# Patient Record
Sex: Female | Born: 1955
Health system: Southern US, Community
[De-identification: ages and names within clinical notes are randomized; demographics above are authoritative.]

## PROBLEM LIST (undated history)

## (undated) DIAGNOSIS — E669 Obesity, unspecified: Secondary | ICD-10-CM

## (undated) DIAGNOSIS — I10 Essential (primary) hypertension: Secondary | ICD-10-CM

## (undated) HISTORY — PX: BREAST BIOPSY: SHX20

## (undated) HISTORY — DX: Essential (primary) hypertension: I10

## (undated) HISTORY — DX: Obesity, unspecified: E66.9

---

## 2000-03-24 ENCOUNTER — Encounter: Payer: Self-pay | Admitting: Family Medicine

## 2000-03-24 ENCOUNTER — Ambulatory Visit (HOSPITAL_COMMUNITY): Admission: RE | Admit: 2000-03-24 | Discharge: 2000-03-24 | Payer: Self-pay | Admitting: Family Medicine

## 2001-01-11 ENCOUNTER — Emergency Department (HOSPITAL_COMMUNITY): Admission: EM | Admit: 2001-01-11 | Discharge: 2001-01-11 | Payer: Self-pay | Admitting: *Deleted

## 2001-09-15 ENCOUNTER — Other Ambulatory Visit: Admission: RE | Admit: 2001-09-15 | Discharge: 2001-09-15 | Payer: Self-pay | Admitting: Obstetrics & Gynecology

## 2002-11-11 ENCOUNTER — Encounter: Payer: Self-pay | Admitting: Family Medicine

## 2002-11-11 ENCOUNTER — Ambulatory Visit (HOSPITAL_COMMUNITY): Admission: RE | Admit: 2002-11-11 | Discharge: 2002-11-11 | Payer: Self-pay | Admitting: Family Medicine

## 2003-11-15 ENCOUNTER — Other Ambulatory Visit: Admission: RE | Admit: 2003-11-15 | Discharge: 2003-11-15 | Payer: Self-pay | Admitting: Obstetrics & Gynecology

## 2004-12-17 ENCOUNTER — Other Ambulatory Visit: Admission: RE | Admit: 2004-12-17 | Discharge: 2004-12-17 | Payer: Self-pay | Admitting: Obstetrics & Gynecology

## 2005-12-25 ENCOUNTER — Other Ambulatory Visit: Admission: RE | Admit: 2005-12-25 | Discharge: 2005-12-25 | Payer: Self-pay | Admitting: Obstetrics & Gynecology

## 2009-05-30 ENCOUNTER — Observation Stay (HOSPITAL_COMMUNITY): Admission: EM | Admit: 2009-05-30 | Discharge: 2009-05-31 | Payer: Self-pay | Admitting: Emergency Medicine

## 2011-01-19 LAB — BASIC METABOLIC PANEL
BUN: 11 mg/dL (ref 6–23)
CO2: 26 mEq/L (ref 19–32)
Chloride: 108 mEq/L (ref 96–112)
Creatinine, Ser: 1 mg/dL (ref 0.4–1.2)
Potassium: 3.5 mEq/L (ref 3.5–5.1)

## 2011-01-19 LAB — POCT I-STAT, CHEM 8
BUN: 9 mg/dL (ref 6–23)
Calcium, Ion: 1.22 mmol/L (ref 1.12–1.32)
Chloride: 106 mEq/L (ref 96–112)
Glucose, Bld: 172 mg/dL — ABNORMAL HIGH (ref 70–99)
Potassium: 3.5 mEq/L (ref 3.5–5.1)

## 2011-01-19 LAB — URINALYSIS, ROUTINE W REFLEX MICROSCOPIC
Bilirubin Urine: NEGATIVE
Glucose, UA: NEGATIVE mg/dL
Hgb urine dipstick: NEGATIVE
Ketones, ur: NEGATIVE mg/dL
pH: 7.5 (ref 5.0–8.0)

## 2011-01-19 LAB — CBC
HCT: 32.6 % — ABNORMAL LOW (ref 36.0–46.0)
MCHC: 32.8 g/dL (ref 30.0–36.0)
MCV: 86 fL (ref 78.0–100.0)
Platelets: 225 10*3/uL (ref 150–400)
RBC: 3.79 MIL/uL — ABNORMAL LOW (ref 3.87–5.11)
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
## 2011-12-05 LAB — HM COLONOSCOPY

## 2013-05-03 ENCOUNTER — Ambulatory Visit: Payer: Self-pay | Admitting: *Deleted

## 2013-05-04 ENCOUNTER — Encounter: Payer: Self-pay | Attending: Internal Medicine | Admitting: *Deleted

## 2013-05-05 ENCOUNTER — Encounter: Payer: Self-pay | Admitting: *Deleted

## 2013-08-12 ENCOUNTER — Other Ambulatory Visit: Payer: Self-pay | Admitting: Occupational Medicine

## 2013-08-12 ENCOUNTER — Ambulatory Visit: Payer: Self-pay

## 2013-08-12 DIAGNOSIS — Z9289 Personal history of other medical treatment: Secondary | ICD-10-CM

## 2017-12-08 DIAGNOSIS — Z0000 Encounter for general adult medical examination without abnormal findings: Secondary | ICD-10-CM | POA: Diagnosis not present

## 2017-12-08 DIAGNOSIS — I129 Hypertensive chronic kidney disease with stage 1 through stage 4 chronic kidney disease, or unspecified chronic kidney disease: Secondary | ICD-10-CM | POA: Diagnosis not present

## 2017-12-08 DIAGNOSIS — I10 Essential (primary) hypertension: Secondary | ICD-10-CM | POA: Diagnosis not present

## 2017-12-08 DIAGNOSIS — N182 Chronic kidney disease, stage 2 (mild): Secondary | ICD-10-CM | POA: Diagnosis not present

## 2017-12-08 DIAGNOSIS — E2839 Other primary ovarian failure: Secondary | ICD-10-CM | POA: Diagnosis not present

## 2017-12-08 DIAGNOSIS — E559 Vitamin D deficiency, unspecified: Secondary | ICD-10-CM | POA: Diagnosis not present

## 2017-12-25 ENCOUNTER — Other Ambulatory Visit: Payer: Self-pay | Admitting: Internal Medicine

## 2017-12-25 DIAGNOSIS — E2839 Other primary ovarian failure: Secondary | ICD-10-CM

## 2017-12-29 ENCOUNTER — Other Ambulatory Visit: Payer: Self-pay | Admitting: Internal Medicine

## 2017-12-29 DIAGNOSIS — Z1231 Encounter for screening mammogram for malignant neoplasm of breast: Secondary | ICD-10-CM

## 2018-01-12 DIAGNOSIS — N182 Chronic kidney disease, stage 2 (mild): Secondary | ICD-10-CM | POA: Diagnosis not present

## 2018-01-12 DIAGNOSIS — R0683 Snoring: Secondary | ICD-10-CM | POA: Diagnosis not present

## 2018-01-12 DIAGNOSIS — Z79899 Other long term (current) drug therapy: Secondary | ICD-10-CM | POA: Diagnosis not present

## 2018-01-12 DIAGNOSIS — I129 Hypertensive chronic kidney disease with stage 1 through stage 4 chronic kidney disease, or unspecified chronic kidney disease: Secondary | ICD-10-CM | POA: Diagnosis not present

## 2018-01-13 ENCOUNTER — Ambulatory Visit: Payer: Self-pay

## 2018-02-10 ENCOUNTER — Ambulatory Visit
Admission: RE | Admit: 2018-02-10 | Discharge: 2018-02-10 | Disposition: A | Discharge status: Home / Self Care | Payer: 59 | Source: Ambulatory Visit | Attending: Internal Medicine | Admitting: Internal Medicine

## 2018-02-10 DIAGNOSIS — E2839 Other primary ovarian failure: Secondary | ICD-10-CM

## 2018-02-10 DIAGNOSIS — Z1231 Encounter for screening mammogram for malignant neoplasm of breast: Secondary | ICD-10-CM | POA: Diagnosis not present

## 2018-02-10 DIAGNOSIS — Z780 Asymptomatic menopausal state: Secondary | ICD-10-CM | POA: Diagnosis not present

## 2018-02-10 DIAGNOSIS — Z13820 Encounter for screening for osteoporosis: Secondary | ICD-10-CM | POA: Diagnosis not present

## 2018-02-13 ENCOUNTER — Other Ambulatory Visit: Payer: Self-pay

## 2018-02-13 ENCOUNTER — Ambulatory Visit: Payer: Self-pay

## 2018-02-25 DIAGNOSIS — N182 Chronic kidney disease, stage 2 (mild): Secondary | ICD-10-CM | POA: Diagnosis not present

## 2018-02-25 DIAGNOSIS — Z6834 Body mass index (BMI) 34.0-34.9, adult: Secondary | ICD-10-CM | POA: Diagnosis not present

## 2018-02-25 DIAGNOSIS — Z9114 Patient's other noncompliance with medication regimen: Secondary | ICD-10-CM | POA: Diagnosis not present

## 2018-02-25 DIAGNOSIS — I129 Hypertensive chronic kidney disease with stage 1 through stage 4 chronic kidney disease, or unspecified chronic kidney disease: Secondary | ICD-10-CM | POA: Diagnosis not present

## 2018-04-30 DIAGNOSIS — N182 Chronic kidney disease, stage 2 (mild): Secondary | ICD-10-CM | POA: Diagnosis not present

## 2018-04-30 DIAGNOSIS — I129 Hypertensive chronic kidney disease with stage 1 through stage 4 chronic kidney disease, or unspecified chronic kidney disease: Secondary | ICD-10-CM | POA: Diagnosis not present

## 2018-04-30 DIAGNOSIS — Z6834 Body mass index (BMI) 34.0-34.9, adult: Secondary | ICD-10-CM | POA: Diagnosis not present

## 2018-08-31 ENCOUNTER — Other Ambulatory Visit: Payer: Self-pay | Admitting: Internal Medicine

## 2018-08-31 ENCOUNTER — Ambulatory Visit: Payer: 59 | Admitting: Internal Medicine

## 2018-08-31 ENCOUNTER — Encounter: Payer: Self-pay | Admitting: Internal Medicine

## 2018-08-31 VITALS — BP 124/78 | HR 88 | Temp 98.2°F | Ht 65.5 in | Wt 208.6 lb

## 2018-08-31 DIAGNOSIS — E7800 Pure hypercholesterolemia, unspecified: Secondary | ICD-10-CM

## 2018-08-31 DIAGNOSIS — E669 Obesity, unspecified: Secondary | ICD-10-CM

## 2018-08-31 DIAGNOSIS — Z6834 Body mass index (BMI) 34.0-34.9, adult: Secondary | ICD-10-CM | POA: Diagnosis not present

## 2018-08-31 DIAGNOSIS — I129 Hypertensive chronic kidney disease with stage 1 through stage 4 chronic kidney disease, or unspecified chronic kidney disease: Secondary | ICD-10-CM

## 2018-08-31 DIAGNOSIS — N182 Chronic kidney disease, stage 2 (mild): Secondary | ICD-10-CM | POA: Diagnosis not present

## 2018-08-31 DIAGNOSIS — R7309 Other abnormal glucose: Secondary | ICD-10-CM

## 2018-08-31 NOTE — Patient Instructions (Signed)
Exercising to Lose Weight Exercising can help you to lose weight. In order to lose weight through exercise, you need to do vigorous-intensity exercise. You can tell that you are exercising with vigorous intensity if you are breathing very hard and fast and cannot hold a conversation while exercising. Moderate-intensity exercise helps to maintain your current weight. You can tell that you are exercising at a moderate level if you have a higher heart rate and faster breathing, but you are still able to hold a conversation. How often should I exercise? Choose an activity that you enjoy and set realistic goals. Your health care provider can help you to make an activity plan that works for you. Exercise regularly as directed by your health care provider. This may include:  Doing resistance training twice each week, such as: ? Push-ups. ? Sit-ups. ? Lifting weights. ? Using resistance bands.  Doing a given intensity of exercise for a given amount of time. Choose from these options: ? 150 minutes of moderate-intensity exercise every week. ? 75 minutes of vigorous-intensity exercise every week. ? A mix of moderate-intensity and vigorous-intensity exercise every week.  Children, pregnant women, people who are out of shape, people who are overweight, and older adults may need to consult a health care provider for individual recommendations. If you have any sort of medical condition, be sure to consult your health care provider before starting a new exercise program. What are some activities that can help me to lose weight?  Walking at a rate of at least 4.5 miles an hour.  Jogging or running at a rate of 5 miles per hour.  Biking at a rate of at least 10 miles per hour.  Lap swimming.  Roller-skating or in-line skating.  Cross-country skiing.  Vigorous competitive sports, such as football, basketball, and soccer.  Jumping rope.  Aerobic dancing. How can I be more active in my day-to-day  activities?  Use the stairs instead of the elevator.  Take a walk during your lunch break.  If you drive, park your car farther away from work or school.  If you take public transportation, get off one stop early and walk the rest of the way.  Make all of your phone calls while standing up and walking around.  Get up, stretch, and walk around every 30 minutes throughout the day. What guidelines should I follow while exercising?  Do not exercise so much that you hurt yourself, feel dizzy, or get very short of breath.  Consult your health care provider prior to starting a new exercise program.  Wear comfortable clothes and shoes with good support.  Drink plenty of water while you exercise to prevent dehydration or heat stroke. Body water is lost during exercise and must be replaced.  Work out until you breathe faster and your heart beats faster. This information is not intended to replace advice given to you by your health care provider. Make sure you discuss any questions you have with your health care provider. Document Released: 11/02/2010 Document Revised: 03/07/2016 Document Reviewed: 03/03/2014 Elsevier Interactive Patient Education  2018 Elsevier Inc.  

## 2018-08-31 NOTE — Progress Notes (Signed)
Subjective:     Patient ID: Shannon RavelingSharon C Roop , female    DOB: 10/20/1955 , 62 y.o.   MRN: [id number]   Chief Complaint  Patient presents with  . Hypertension    HPI  Hypertension  This is a chronic problem. The current episode started more than 1 year ago. The problem has been gradually improving since onset. The problem is controlled. Pertinent negatives include no blurred vision, chest pain, headaches, neck pain, orthopnea, palpitations or shortness of breath. Risk factors for coronary artery disease include sedentary lifestyle, obesity and post-menopausal state.     Past Medical History:  Diagnosis Date  . HTN (hypertension)   . Obesity      Family History  Problem Relation Age of Onset  . Hypertension Mother   . Diabetes Mother   . Cancer Mother   . Stroke Father      Current Outpatient Medications:  .  amLODipine (NORVASC) 5 MG tablet, Take 5 mg by mouth daily., Disp: , Rfl:  .  Ascorbic Acid (VITAMIN C PO), Take by mouth., Disp: , Rfl:  .  Probiotic Product (PROBIOTIC PO), Take by mouth., Disp: , Rfl:  .  telmisartan-hydrochlorothiazide (MICARDIS HCT) 80-25 MG tablet, Take 1 tablet by mouth daily., Disp: , Rfl:  .  Vitamin D, Ergocalciferol, (DRISDOL) 1.25 MG (50000 UT) CAPS capsule, Take 50,000 Units by mouth every 7 (seven) days., Disp: , Rfl:    Allergies  Allergen Reactions  . Codeine Nausea And Vomiting     Review of Systems  Constitutional: Negative.   Eyes: Negative for blurred vision.  Respiratory: Negative.  Negative for shortness of breath.   Cardiovascular: Negative.  Negative for chest pain, palpitations and orthopnea.  Gastrointestinal: Negative.   Musculoskeletal: Negative for neck pain.  Skin: Negative.   Neurological: Negative.  Negative for headaches.  Psychiatric/Behavioral: Negative.      Today's Vitals   08/31/18 1039  BP: 124/78  Pulse: 88  Temp: 98.2 F (36.8 C)  TempSrc: Oral  Weight: 208 lb 9.6 oz (94.6 kg)  Height: 5' 5.5"  (1.664 m)   Body mass index is 34.18 kg/m.   Objective:  Physical Exam  Constitutional: She is oriented to person, place, and time. She appears well-developed and well-nourished.  HENT:  Head: Normocephalic and atraumatic.  Eyes: EOM are normal.  Cardiovascular: Normal rate, regular rhythm and normal heart sounds.  Pulmonary/Chest: Effort normal and breath sounds normal.  Neurological: She is alert and oriented to person, place, and time.  Psychiatric: She has a normal mood and affect.  Nursing note and vitals reviewed.       Assessment And Plan:     1. Hypertensive nephropathy  Well controlled. She will continue with current meds. She is encouraged to avoid adding salt to her foods. She will rto in six months for re-evaluation. Her labs will be sent to [facility] as requested.   - COMPLETE METABOLIC PANEL WITH GFR - Lipid Profile  2. Chronic renal disease, stage II  Chronic. She is encouraged to stay well hydrated.   3. Other abnormal glucose  HER A1C HAS BEEN ELEVATED IN THE PAST. I WILL CHECK AN A1C, BMET TODAY. SHE WAS ENCOURAGED TO AVOID SUGARY BEVERAGES AND PROCESSED FOODS INCLUDNG BREADS, RICE AND PASTA.  - Hemoglobin A1c  4. Pure hypercholesterolemia  I will check a fasting lipid panel. She is encouraged to avoid fried foods and to incorporate more exercise into her daily routine.   - Lipid Profile  5.  Class 1 obesity with serious comorbidity and body mass index (BMI) of 34.0 to 34.9 in adult, unspecified obesity type  She is encouraged to strive for BMI less than 30 to decrease cardiac risk. She is encouraged to avoid sugary beverages, processed foods and to exercise no less than five days weekly. She will rto in six months for re-evaluation.        Gwynneth Aliment, MD

## 2018-09-01 LAB — COMPLETE METABOLIC PANEL WITH GFR
AG RATIO: 1.6 (calc) (ref 1.0–2.5)
ALBUMIN MSPROF: 4.2 g/dL (ref 3.6–5.1)
ALT: 18 U/L (ref 6–29)
AST: 16 U/L (ref 10–35)
Alkaline phosphatase (APISO): 73 U/L (ref 33–130)
BILIRUBIN TOTAL: 0.6 mg/dL (ref 0.2–1.2)
BUN: 11 mg/dL (ref 7–25)
CHLORIDE: 102 mmol/L (ref 98–110)
CO2: 30 mmol/L (ref 20–32)
Calcium: 10 mg/dL (ref 8.6–10.4)
Creat: 0.95 mg/dL (ref 0.50–0.99)
GFR, EST AFRICAN AMERICAN: 75 mL/min/{1.73_m2} (ref >=60)
GFR, Est Non African American: 65 mL/min/{1.73_m2} (ref >=60)
GLOBULIN: 2.7 g/dL (ref 1.9–3.7)
GLUCOSE: 111 mg/dL — AB (ref 65–99)
Potassium: 3.3 mmol/L — ABNORMAL LOW (ref 3.5–5.3)
SODIUM: 141 mmol/L (ref 135–146)
TOTAL PROTEIN: 6.9 g/dL (ref 6.1–8.1)

## 2018-09-01 LAB — HEMOGLOBIN A1C
Hgb A1c MFr Bld: 6.3 % of total Hgb — ABNORMAL HIGH (ref <5.7)
Mean Plasma Glucose: 134 (calc)
eAG (mmol/L): 7.4 (calc)

## 2018-09-01 LAB — LIPID PANEL
CHOLESTEROL: 208 mg/dL — AB (ref <200)
HDL: 43 mg/dL — ABNORMAL LOW (ref >50)
LDL Cholesterol (Calc): 138 mg/dL (calc) — ABNORMAL HIGH
Non-HDL Cholesterol (Calc): 165 mg/dL (calc) — ABNORMAL HIGH (ref <130)
Total CHOL/HDL Ratio: 4.8 (calc) (ref <5.0)
Triglycerides: 147 mg/dL (ref <150)

## 2018-09-04 NOTE — Progress Notes (Signed)
Your ldl, bad chol is 138. Ideally, this should be less than 100.  Your kidney fxn is stable. Your potassium level is sl. Low, please increase intake of potassium rich foods. Please mail her a list of foods. Liver function is normal. Your hba1c is 6.3, this is in predm range. At 6.5 or above, you will be considered diabetic.

## 2018-09-09 ENCOUNTER — Telehealth: Payer: Self-pay

## 2018-09-09 NOTE — Telephone Encounter (Signed)
-----   Message from Dorothyann Pengobyn Sanders, MD sent at 09/04/2018  7:53 PM EST ----- Your ldl, bad chol is 138. Ideally, this should be less than 100.  Your kidney fxn is stable. Your potassium level is sl. Low, please increase intake of potassium rich foods. Please mail her a list of foods. Liver function is normal. Your hba1c is 6.3, this is in predm range. At 6.5 or above, you will be considered diabetic.

## 2018-09-09 NOTE — Telephone Encounter (Signed)
Left the pt a message to call back for her lab results. 

## 2018-10-06 ENCOUNTER — Ambulatory Visit: Payer: 59 | Admitting: Internal Medicine

## 2018-10-06 ENCOUNTER — Encounter: Payer: Self-pay | Admitting: Internal Medicine

## 2018-10-06 VITALS — BP 130/86 | HR 81 | Temp 98.1°F | Ht 65.5 in | Wt 209.8 lb

## 2018-10-06 DIAGNOSIS — E6609 Other obesity due to excess calories: Secondary | ICD-10-CM | POA: Diagnosis not present

## 2018-10-06 DIAGNOSIS — E66811 Obesity, class 1: Secondary | ICD-10-CM | POA: Insufficient documentation

## 2018-10-06 DIAGNOSIS — Z6834 Body mass index (BMI) 34.0-34.9, adult: Secondary | ICD-10-CM | POA: Diagnosis not present

## 2018-10-06 DIAGNOSIS — K047 Periapical abscess without sinus: Secondary | ICD-10-CM | POA: Diagnosis not present

## 2018-10-06 DIAGNOSIS — K0889 Other specified disorders of teeth and supporting structures: Secondary | ICD-10-CM | POA: Diagnosis not present

## 2018-10-06 DIAGNOSIS — I10 Essential (primary) hypertension: Secondary | ICD-10-CM | POA: Insufficient documentation

## 2018-10-06 MED ORDER — FLUCONAZOLE 150 MG PO TABS: ORAL_TABLET | ORAL | 0 refills | Status: DC

## 2018-10-06 MED ORDER — IBUPROFEN 800 MG PO TABS: 800.0000 mg | ORAL_TABLET | Freq: Three times a day (TID) | ORAL | 0 refills | Status: DC | PRN

## 2018-10-06 MED ORDER — AMOXICILLIN-POT CLAVULANATE 875-125 MG PO TABS: 1.0000 | ORAL_TABLET | Freq: Two times a day (BID) | ORAL | 0 refills | Status: AC

## 2018-10-10 ENCOUNTER — Encounter: Payer: Self-pay | Admitting: Internal Medicine

## 2018-10-10 NOTE — Progress Notes (Signed)
Subjective:     Patient ID: Shannon RavelingSharon C Tremain , female    DOB: 10/20/1955 , 62 y.o.   MRN: [id number]   Chief Complaint  Patient presents with  . Dental Pain    patient states her tooth broke during the summer time and she is now starting to have some tooth pain but she is unable to see dentist January.    HPI  Dental Pain   This is a recurrent problem. The current episode started in the past 7 days. The problem occurs constantly. The problem has been unchanged. The pain is at a severity of 6/10. The pain is moderate. She has tried acetaminophen for the symptoms. The treatment provided no relief.   She reports she broke her tooth earlier this summer. Had to find new dentist b/c previous dentist was not in her plan. She is not able to be seen until Jan 2020. She has having severe tooth pain. No relief with otc meds.   Past Medical History:  Diagnosis Date  . HTN (hypertension)   . Obesity      Family History  Problem Relation Age of Onset  . Hypertension Mother   . Diabetes Mother   . Cancer Mother   . Stroke Father      Current Outpatient Medications:  .  amLODipine (NORVASC) 5 MG tablet, Take 5 mg by mouth daily., Disp: , Rfl:  .  Ascorbic Acid (VITAMIN C PO), Take by mouth., Disp: , Rfl:  .  Cholecalciferol (VITAMIN D-3) 125 MCG (5000 UT) TABS, Take 1 tablet by mouth daily., Disp: , Rfl:  .  Probiotic Product (PROBIOTIC PO), Take by mouth., Disp: , Rfl:  .  telmisartan-hydrochlorothiazide (MICARDIS HCT) 80-25 MG tablet, Take 1 tablet by mouth daily., Disp: , Rfl:  .  amoxicillin-clavulanate (AUGMENTIN) 875-125 MG tablet, Take 1 tablet by mouth 2 (two) times daily for 10 days., Disp: 20 tablet, Rfl: 0 .  fluconazole (DIFLUCAN) 150 MG tablet, Take one tablet today, repeat in 48 hours, Disp: 2 tablet, Rfl: 0 .  ibuprofen (ADVIL,MOTRIN) 800 MG tablet, Take 1 tablet (800 mg total) by mouth every 8 (eight) hours as needed., Disp: 30 tablet, Rfl: 0   Allergies  Allergen Reactions   . Codeine Nausea And Vomiting     Review of Systems  Constitutional: Negative.   HENT: Positive for facial swelling.   Respiratory: Negative.   Cardiovascular: Negative.   Gastrointestinal: Negative.   Neurological: Negative.   Psychiatric/Behavioral: Negative.      Today's Vitals   10/06/18 1412  BP: 130/86  Pulse: 81  Temp: 98.1 F (36.7 C)  TempSrc: Oral  SpO2: 96%  Weight: 209 lb 12.8 oz (95.2 kg)  Height: 5' 5.5" (1.664 m)  PainSc: 2   PainLoc: Mouth   Body mass index is 34.38 kg/m.   Objective:  Physical Exam Vitals signs and nursing note reviewed.  Constitutional:      General: She is in acute distress.     Appearance: Normal appearance.  HENT:     Head: Normocephalic and atraumatic.     Mouth/Throat:     Comments: Multiple caries, left maxillary molar with deep cavity. There is some gingival swelling and left cheek swelling.  Cardiovascular:     Rate and Rhythm: Normal rate and regular rhythm.     Heart sounds: Normal heart sounds.  Pulmonary:     Effort: Pulmonary effort is normal.     Breath sounds: Normal breath sounds.  Neurological:  General: No focal deficit present.     Mental Status: She is alert.         Assessment And Plan:     1. Tooth infection  She was given rx augmentin to take as directed. She is encouraged to complete full abx course.   2. Tooth pain  She was given rx ibuprofen to use prn. She is allergic to codeine and hesitant to take vicodin or tramadol. She will let me know if the ibuprofen is not effective at controlling her pain.   3. Class 1 obesity due to excess calories with serious comorbidity and body mass index (BMI) of 34.0 to 34.9 in adult  She is encouraged to strive for BMI less than 30 to decrease cardiac risk.   Gwynneth Alimentobyn N Nikiah Goin, MD

## 2018-11-11 ENCOUNTER — Telehealth: Payer: Self-pay

## 2018-11-11 MED ORDER — FLUCONAZOLE 150 MG PO TABS: ORAL_TABLET | ORAL | 0 refills | Status: DC

## 2018-11-11 NOTE — Telephone Encounter (Signed)
Left the pt a message that the rx for diflucan was faxed to the pharmacy.

## 2019-01-18 ENCOUNTER — Encounter: Payer: Self-pay | Admitting: Internal Medicine

## 2019-02-05 ENCOUNTER — Other Ambulatory Visit: Payer: Self-pay | Admitting: Internal Medicine

## 2019-03-01 ENCOUNTER — Ambulatory Visit (INDEPENDENT_AMBULATORY_CARE_PROVIDER_SITE_OTHER): Payer: 59 | Admitting: Internal Medicine

## 2019-03-01 ENCOUNTER — Encounter: Payer: Self-pay | Admitting: Internal Medicine

## 2019-03-01 ENCOUNTER — Other Ambulatory Visit: Payer: Self-pay

## 2019-03-01 VITALS — BP 154/96 | HR 90 | Temp 98.7°F | Ht 66.6 in | Wt 213.0 lb

## 2019-03-01 DIAGNOSIS — Z0000 Encounter for general adult medical examination without abnormal findings: Secondary | ICD-10-CM

## 2019-03-01 DIAGNOSIS — I129 Hypertensive chronic kidney disease with stage 1 through stage 4 chronic kidney disease, or unspecified chronic kidney disease: Secondary | ICD-10-CM

## 2019-03-01 DIAGNOSIS — N182 Chronic kidney disease, stage 2 (mild): Secondary | ICD-10-CM | POA: Diagnosis not present

## 2019-03-01 LAB — POCT UA - MICROALBUMIN
Albumin/Creatinine Ratio, Urine, POC: <30
Creatinine, POC: 200 mg/dL
Microalbumin Ur, POC: 10 mg/L

## 2019-03-01 LAB — POCT URINALYSIS DIPSTICK
Bilirubin, UA: NEGATIVE
Blood, UA: NEGATIVE
Glucose, UA: NEGATIVE
Ketones, UA: NEGATIVE
Leukocytes, UA: NEGATIVE
Nitrite, UA: NEGATIVE
Protein, UA: NEGATIVE
Spec Grav, UA: 1.02 (ref 1.010–1.025)
Urobilinogen, UA: 0.2 E.U./dL
pH, UA: 6 (ref 5.0–8.0)

## 2019-03-01 NOTE — Patient Instructions (Signed)

## 2019-03-02 ENCOUNTER — Encounter: Payer: Self-pay | Admitting: Internal Medicine

## 2019-03-02 LAB — COMPLETE METABOLIC PANEL WITH GFR
AG Ratio: 1.5 (calc) (ref 1.0–2.5)
ALT: 13 U/L (ref 6–29)
AST: 14 U/L (ref 10–35)
Albumin: 4.4 g/dL (ref 3.6–5.1)
Alkaline phosphatase (APISO): 72 U/L (ref 37–153)
BUN: 11 mg/dL (ref 7–25)
CO2: 28 mmol/L (ref 20–32)
Calcium: 10.3 mg/dL (ref 8.6–10.4)
Chloride: 101 mmol/L (ref 98–110)
Creat: 0.96 mg/dL (ref 0.50–0.99)
GFR, Est African American: 73 mL/min/{1.73_m2} (ref >=60)
GFR, Est Non African American: 63 mL/min/{1.73_m2} (ref >=60)
Globulin: 2.9 g/dL (calc) (ref 1.9–3.7)
Glucose, Bld: 97 mg/dL (ref 65–99)
Potassium: 4.1 mmol/L (ref 3.5–5.3)
Sodium: 139 mmol/L (ref 135–146)
Total Bilirubin: 0.4 mg/dL (ref 0.2–1.2)
Total Protein: 7.3 g/dL (ref 6.1–8.1)

## 2019-03-02 LAB — TSH: TSH: 1.84 mIU/L (ref 0.40–4.50)

## 2019-03-02 LAB — CBC
HCT: 38.5 % (ref 35.0–45.0)
Hemoglobin: 12.7 g/dL (ref 11.7–15.5)
MCH: 27.9 pg (ref 27.0–33.0)
MCHC: 33 g/dL (ref 32.0–36.0)
MCV: 84.6 fL (ref 80.0–100.0)
MPV: 11.3 fL (ref 7.5–12.5)
Platelets: 285 10*3/uL (ref 140–400)
RBC: 4.55 10*6/uL (ref 3.80–5.10)
RDW: 13.2 % (ref 11.0–15.0)
WBC: 5.5 10*3/uL (ref 3.8–10.8)

## 2019-03-02 LAB — LIPID PANEL
Cholesterol: 212 mg/dL — ABNORMAL HIGH (ref <200)
HDL: 54 mg/dL (ref >=50)
LDL Cholesterol (Calc): 137 mg/dL (calc) — ABNORMAL HIGH
Non-HDL Cholesterol (Calc): 158 mg/dL (calc) — ABNORMAL HIGH (ref <130)
Total CHOL/HDL Ratio: 3.9 (calc) (ref <5.0)
Triglycerides: 105 mg/dL (ref <150)

## 2019-03-02 LAB — HEMOGLOBIN A1C
Hgb A1c MFr Bld: 6.1 % of total Hgb — ABNORMAL HIGH (ref <5.7)
Mean Plasma Glucose: 128 (calc)
eAG (mmol/L): 7.1 (calc)

## 2019-03-04 ENCOUNTER — Encounter: Payer: Self-pay | Admitting: Internal Medicine

## 2019-03-08 NOTE — Progress Notes (Signed)
Subjective:     Patient ID: Shannon Mendoza , female    DOB: 28-Apr-1956 , 63 y.o.   MRN: [id number]   Chief Complaint  Patient presents with  . Annual Exam  . Hypertension    HPI  She is here today for a full physical examination. She has no specific concerns or complaints at this time. She is followed by GYN for her pelvic examinations.   Hypertension  This is a chronic problem. The current episode started more than 1 year ago. The problem has been gradually improving since onset. The problem is uncontrolled. Pertinent negatives include no blurred vision, chest pain, palpitations or shortness of breath. Risk factors for coronary artery disease include obesity, post-menopausal state and sedentary lifestyle. The current treatment provides moderate improvement.     Past Medical History:  Diagnosis Date  . HTN (hypertension)   . Obesity      Family History  Problem Relation Age of Onset  . Hypertension Mother   . Diabetes Mother   . Cancer Mother   . Stroke Father      Current Outpatient Medications:  .  amLODipine (NORVASC) 5 MG tablet, TAKE 1 TABLET BY MOUTH EVERY DAY, Disp: 90 tablet, Rfl: 1 .  telmisartan-hydrochlorothiazide (MICARDIS HCT) 80-25 MG tablet, Take 1 tablet by mouth daily., Disp: , Rfl:  .  Ascorbic Acid (VITAMIN C PO), Take by mouth., Disp: , Rfl:  .  Cholecalciferol (VITAMIN D-3) 125 MCG (5000 UT) TABS, Take 1 tablet by mouth daily., Disp: , Rfl:  .  Probiotic Product (PROBIOTIC PO), Take by mouth., Disp: , Rfl:    Allergies  Allergen Reactions  . Codeine Nausea And Vomiting     The patient states she uses none for birth control. Last LMP was No LMP recorded. Patient is postmenopausal.. Negative for Dysmenorrhea  Negative for: breast discharge, breast lump(s), breast pain and breast self exam. Associated symptoms include abnormal vaginal bleeding. Pertinent negatives include abnormal bleeding (hematology), anxiety, decreased libido, depression,  difficulty falling sleep, dyspareunia, history of infertility, nocturia, sexual dysfunction, sleep disturbances, urinary incontinence, urinary urgency, vaginal discharge and vaginal itching. Diet regular.The patient states her exercise level is  minimal.  . The patient's tobacco use is:  Social History   Tobacco Use  Smoking Status Never Smoker  Smokeless Tobacco Never Used  . She has been exposed to passive smoke. The patient's alcohol use is:  Social History   Substance and Sexual Activity  Alcohol Use Not Currently    Review of Systems  Constitutional: Negative.   HENT: Negative.   Eyes: Negative.  Negative for blurred vision.  Respiratory: Negative.  Negative for shortness of breath.   Cardiovascular: Negative.  Negative for chest pain and palpitations.  Gastrointestinal: Negative.   Endocrine: Negative.   Genitourinary: Negative.   Musculoskeletal: Negative.   Skin: Negative.   Allergic/Immunologic: Negative.   Neurological: Negative.   Hematological: Negative.   Psychiatric/Behavioral: Negative.      Today's Vitals   03/01/19 1140  BP: (!) 154/96  Pulse: 90  Temp: 98.7 F (37.1 C)  TempSrc: Oral  Weight: 213 lb (96.6 kg)  Height: 5' 6.6" (1.692 m)   Body mass index is 33.76 kg/m.   Objective:  Physical Exam Vitals signs and nursing note reviewed.  Constitutional:      Appearance: Normal appearance.  HENT:     Head: Normocephalic and atraumatic.     Right Ear: Tympanic membrane, ear canal and external ear normal.  Left Ear: Tympanic membrane, ear canal and external ear normal.     Nose: Nose normal.     Mouth/Throat:     Mouth: Mucous membranes are moist.     Pharynx: Oropharynx is clear.  Eyes:     Extraocular Movements: Extraocular movements intact.     Conjunctiva/sclera: Conjunctivae normal.     Pupils: Pupils are equal, round, and reactive to light.  Neck:     Musculoskeletal: Normal range of motion and neck supple.  Cardiovascular:      Rate and Rhythm: Normal rate and regular rhythm.     Pulses: Normal pulses.     Heart sounds: Normal heart sounds.  Pulmonary:     Effort: Pulmonary effort is normal.     Breath sounds: Normal breath sounds.  Chest:     Breasts:        Right: Normal. No swelling, bleeding, inverted nipple, mass or nipple discharge.        Left: Normal. No swelling, bleeding, inverted nipple, mass or nipple discharge.  Abdominal:     General: Bowel sounds are normal.     Palpations: Abdomen is soft.  Genitourinary:    Comments: deferred Musculoskeletal: Normal range of motion.  Skin:    General: Skin is warm and dry.  Neurological:     General: No focal deficit present.     Mental Status: She is alert and oriented to person, place, and time.  Psychiatric:        Mood and Affect: Mood normal.        Behavior: Behavior normal.         Assessment And Plan:     1. Routine general medical examination at health care facility  A full exam was performed. Importance of monthly self breast exams was discussed with the patient. PATIENT HAS BEEN ADVISED TO GET 30-45 MINUTES REGULAR EXERCISE NO LESS THAN FOUR TO FIVE DAYS PER WEEK - BOTH WEIGHTBEARING EXERCISES AND AEROBIC ARE RECOMMENDED.  SHE WAS ADVISED TO FOLLOW A HEALTHY DIET WITH AT LEAST SIX FRUITS/VEGGIES PER DAY, DECREASE INTAKE OF RED MEAT, AND TO INCREASE FISH INTAKE TO TWO DAYS PER WEEK.  MEATS/FISH SHOULD NOT BE FRIED, BAKED OR BROILED IS PREFERABLE.  I SUGGEST WEARING SPF 50 SUNSCREEN ON EXPOSED PARTS AND ESPECIALLY WHEN IN THE DIRECT SUNLIGHT FOR AN EXTENDED PERIOD OF TIME.  PLEASE AVOID FAST FOOD RESTAURANTS AND INCREASE YOUR WATER INTAKE.  - CBC - Lipid panel - Hemoglobin A1c - TSH  2. Hypertensive nephropathy  Uncontrolled. She has yet to take her meds today. She will continue with current meds. EKG performed, no acute changes noted. She is encouraged to take meds daily, avoid adding salt to her foods and to avoid packaged foods which  tend to be high in sodium. She will rto in six months for re-evaluation. Importance of regular exercise was also discussed with the patient.  - COMPLETE METABOLIC PANEL WITH GFR - POCT Urinalysis Dipstick (81002) - POCT UA - Microalbumin - EKG 12-Lead  3. Chronic renal disease, stage II  Chronic. She is encouraged to stay well hydrated. I will check GFR, Cr today.   Gwynneth Alimentobyn N Leveda Kendrix, MD    THE PATIENT IS ENCOURAGED TO PRACTICE SOCIAL DISTANCING DUE TO THE COVID-19 PANDEMIC.

## 2019-04-06 ENCOUNTER — Other Ambulatory Visit: Payer: Self-pay | Admitting: Internal Medicine

## 2019-04-06 DIAGNOSIS — Z1231 Encounter for screening mammogram for malignant neoplasm of breast: Secondary | ICD-10-CM

## 2019-04-26 ENCOUNTER — Other Ambulatory Visit: Payer: Self-pay | Admitting: Internal Medicine

## 2019-04-28 ENCOUNTER — Other Ambulatory Visit: Payer: Self-pay

## 2019-04-28 MED ORDER — TELMISARTAN-HCTZ 80-25 MG PO TABS: 1.0000 | ORAL_TABLET | Freq: Every day | ORAL | 1 refills | Status: DC

## 2019-05-17 ENCOUNTER — Ambulatory Visit
Admission: RE | Admit: 2019-05-17 | Discharge: 2019-05-17 | Disposition: A | Discharge status: Home / Self Care | Payer: 59 | Source: Ambulatory Visit | Attending: Internal Medicine | Admitting: Internal Medicine

## 2019-05-17 ENCOUNTER — Other Ambulatory Visit: Payer: Self-pay

## 2019-05-17 DIAGNOSIS — Z1231 Encounter for screening mammogram for malignant neoplasm of breast: Secondary | ICD-10-CM

## 2019-10-27 ENCOUNTER — Other Ambulatory Visit: Payer: Self-pay

## 2019-10-27 ENCOUNTER — Other Ambulatory Visit: Payer: Self-pay | Admitting: Internal Medicine

## 2019-11-18 IMAGING — MG DIGITAL SCREENING BILATERAL MAMMOGRAM WITH TOMO AND CAD
8 series · 8 of 24 positions shown · non-contrast
Comparison: Previous exam(s).

CLINICAL DATA: Screening.

EXAM:
DIGITAL SCREENING BILATERAL MAMMOGRAM WITH TOMO AND CAD

[L MLO synth-2D]
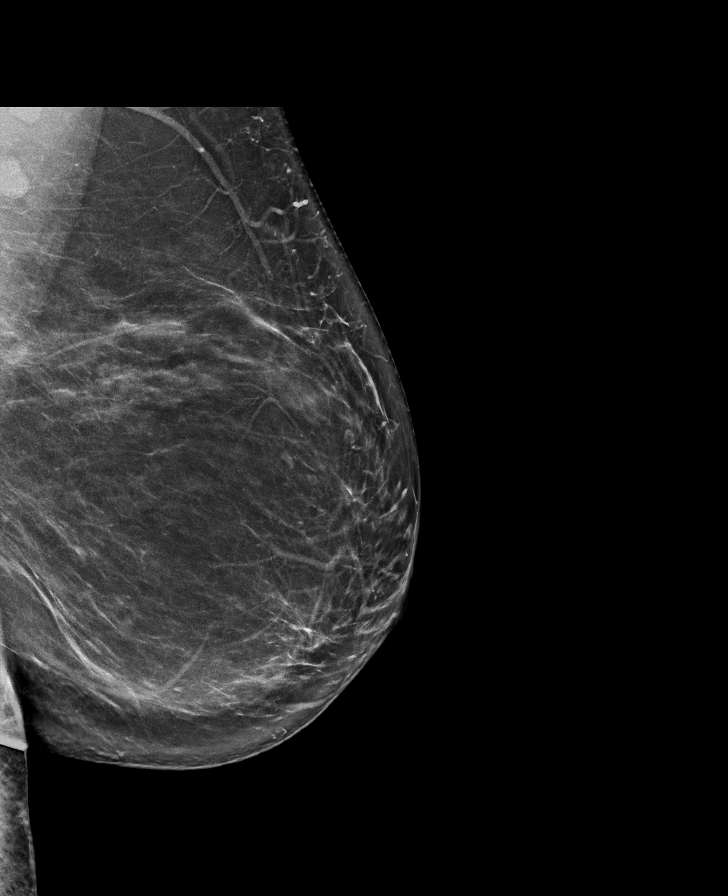

[R MLO synth-2D]
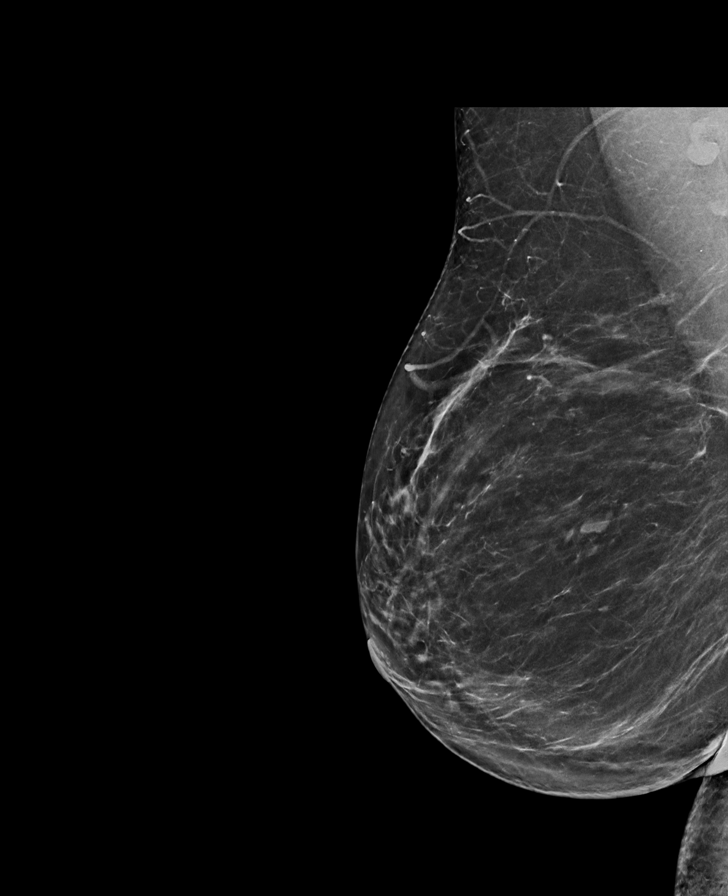

[R CC synth-2D]
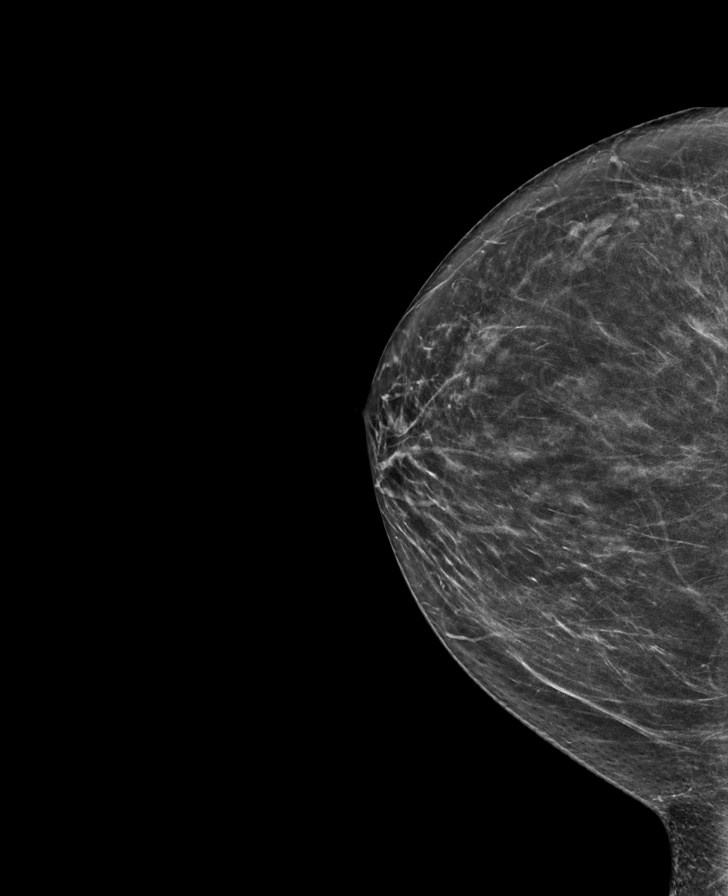

[L CC synth-2D]
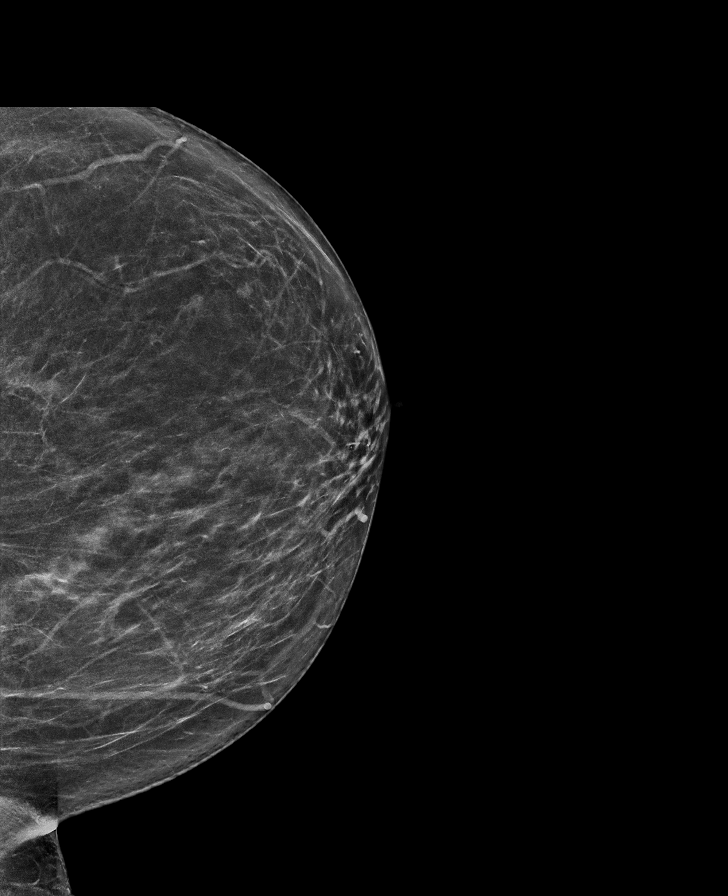

[L CC tomo · tomo slice 36/71.0]
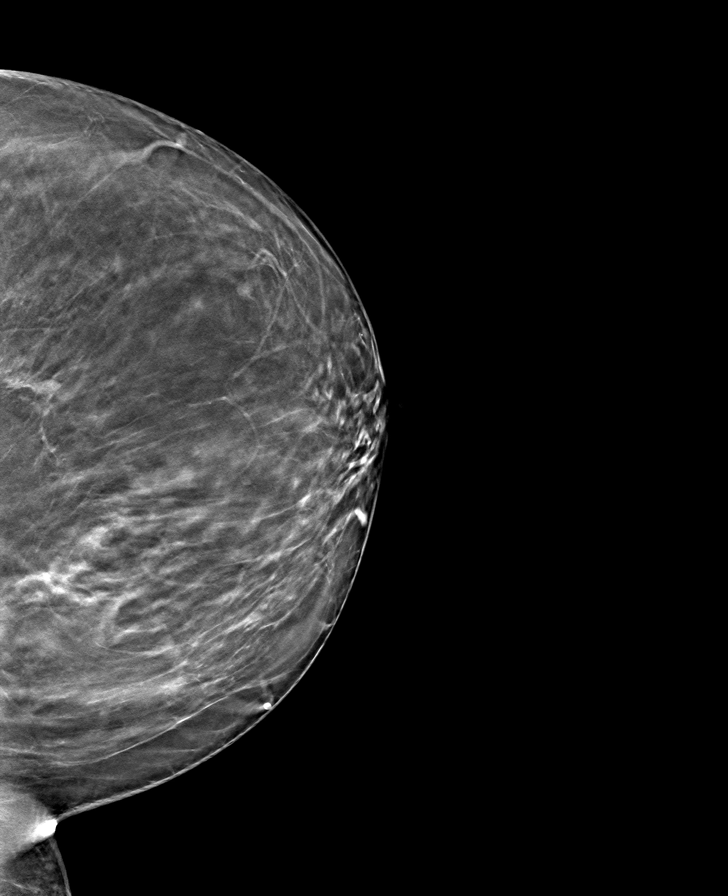

[R MLO tomo · tomo slice 41/81.0]
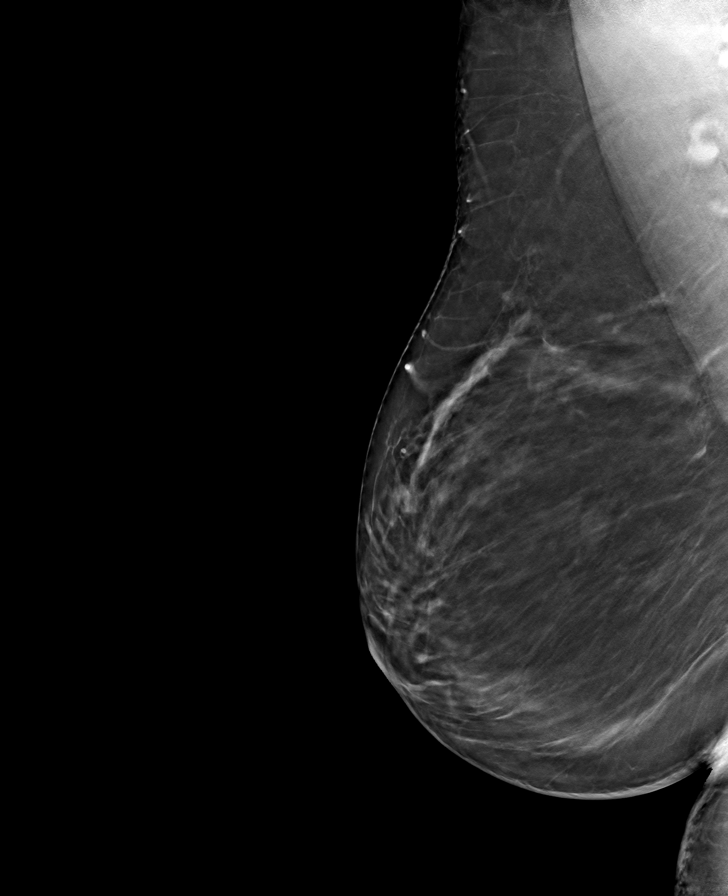

[L MLO tomo · tomo slice 43/84.0]
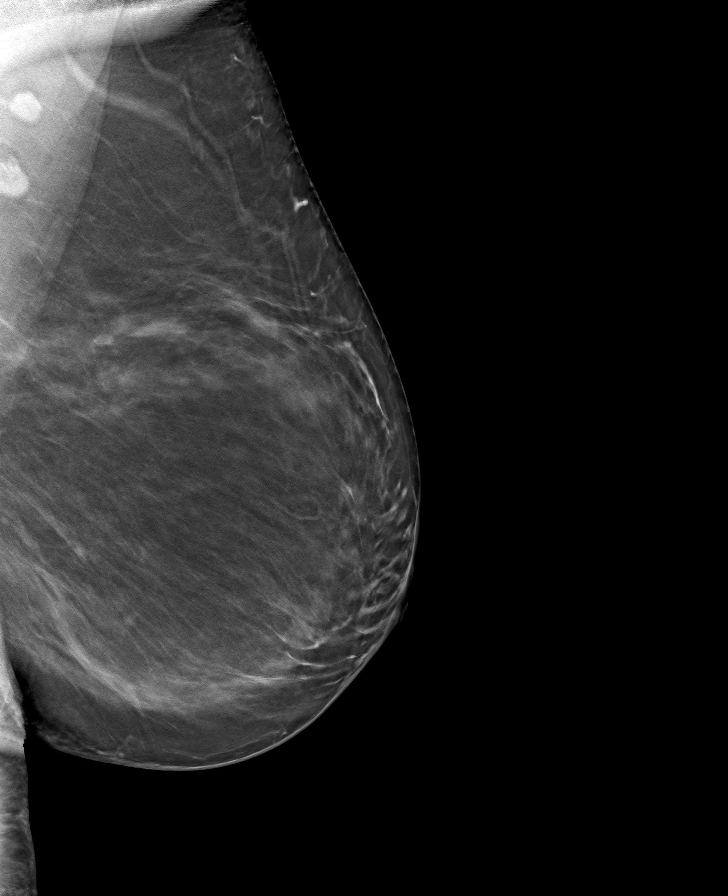

[R CC tomo · tomo slice 35/69.0]
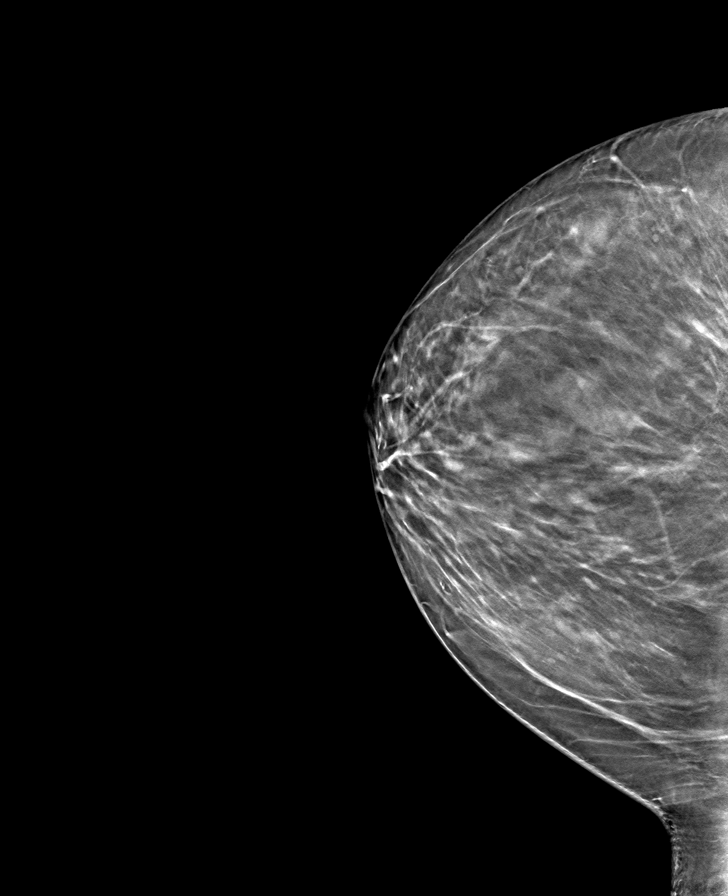

[8 of 24 positions shown; findings below may reference images not displayed]

ACR Breast Density Category b: There are scattered areas of
fibroglandular density.
FINDINGS: There are no findings suspicious for malignancy. Images were
processed with CAD.
IMPRESSION: No mammographic evidence of malignancy. A result letter of this
screening mammogram will be mailed directly to the patient.

RECOMMENDATION:
Screening mammogram in one year. (Code:[id number])

BI-RADS CATEGORY  1: Negative.

## 2019-12-30 ENCOUNTER — Ambulatory Visit: Payer: 59 | Attending: Internal Medicine

## 2019-12-30 DIAGNOSIS — Z23 Encounter for immunization: Secondary | ICD-10-CM

## 2019-12-30 NOTE — Progress Notes (Signed)
   Covid-19 Vaccination Clinic  Name:  Shannon Mendoza    MRN: [id number] DOB: 24-Mar-1956  12/30/2019  [name] was observed post Covid-19 immunization for 15 minutes without incident. She was provided with Vaccine Information Sheet and instruction to access the V-Safe system.   [name] was instructed to call 911 with any severe reactions post vaccine: Marland Kitchen Difficulty breathing  . Swelling of face and throat  . A fast heartbeat  . A bad rash all over body  . Dizziness and weakness   Immunizations Administered    Name Date Dose VIS Date Route   Pfizer COVID-19 Vaccine 12/30/2019  8:18 AM 0.3 mL 09/24/2019 Intramuscular   Manufacturer: [employer], [employer]   Lot: [id number]   NDC: [address]-3

## 2020-01-24 ENCOUNTER — Ambulatory Visit: Payer: 59 | Attending: Internal Medicine

## 2020-01-24 DIAGNOSIS — Z23 Encounter for immunization: Secondary | ICD-10-CM

## 2020-01-24 NOTE — Progress Notes (Signed)
   Covid-19 Vaccination Clinic  Name:  XAN SPARKMAN    MRN: [id number] DOB: November 27, 1955  01/24/2020  Ms. Hammac was observed post Covid-19 immunization for 15 minutes without incident. She was provided with Vaccine Information Sheet and instruction to access the V-Safe system.   Ms. Seguin was instructed to call 911 with any severe reactions post vaccine: Marland Kitchen Difficulty breathing  . Swelling of face and throat  . A fast heartbeat  . A bad rash all over body  . Dizziness and weakness   Immunizations Administered    Name Date Dose VIS Date Route   Pfizer COVID-19 Vaccine 01/24/2020  8:08 AM 0.3 mL 09/24/2019 Intramuscular   Manufacturer: [employer], [employer]   Lot: [id number]   NDC: [address]-7

## 2020-01-25 ENCOUNTER — Other Ambulatory Visit: Payer: Self-pay | Admitting: Internal Medicine

## 2020-01-26 ENCOUNTER — Other Ambulatory Visit: Payer: Self-pay | Admitting: Internal Medicine

## 2020-03-06 ENCOUNTER — Ambulatory Visit: Payer: 59 | Admitting: Internal Medicine

## 2020-03-06 ENCOUNTER — Other Ambulatory Visit: Payer: Self-pay

## 2020-03-06 ENCOUNTER — Encounter: Payer: Self-pay | Admitting: Internal Medicine

## 2020-03-06 VITALS — BP 142/100 | HR 90 | Temp 98.0°F | Ht 65.8 in | Wt 216.8 lb

## 2020-03-06 DIAGNOSIS — M79609 Pain in unspecified limb: Secondary | ICD-10-CM

## 2020-03-06 DIAGNOSIS — M25511 Pain in right shoulder: Secondary | ICD-10-CM | POA: Diagnosis not present

## 2020-03-06 DIAGNOSIS — Z0000 Encounter for general adult medical examination without abnormal findings: Secondary | ICD-10-CM

## 2020-03-06 DIAGNOSIS — N182 Chronic kidney disease, stage 2 (mild): Secondary | ICD-10-CM | POA: Diagnosis not present

## 2020-03-06 DIAGNOSIS — M25531 Pain in right wrist: Secondary | ICD-10-CM | POA: Diagnosis not present

## 2020-03-06 DIAGNOSIS — I129 Hypertensive chronic kidney disease with stage 1 through stage 4 chronic kidney disease, or unspecified chronic kidney disease: Secondary | ICD-10-CM | POA: Insufficient documentation

## 2020-03-06 DIAGNOSIS — R202 Paresthesia of skin: Secondary | ICD-10-CM

## 2020-03-06 DIAGNOSIS — G8929 Other chronic pain: Secondary | ICD-10-CM

## 2020-03-06 LAB — COMPLETE METABOLIC PANEL WITH GFR
AG Ratio: 1.5 (calc) (ref 1.0–2.5)
ALT: 16 U/L (ref 6–29)
AST: 16 U/L (ref 10–35)
Albumin: 4.3 g/dL (ref 3.6–5.1)
Alkaline phosphatase (APISO): 79 U/L (ref 37–153)
BUN: 16 mg/dL (ref 7–25)
CO2: 31 mmol/L (ref 20–32)
Calcium: 10.2 mg/dL (ref 8.6–10.4)
Chloride: 101 mmol/L (ref 98–110)
Creat: 0.96 mg/dL (ref 0.50–0.99)
GFR, Est African American: 73 mL/min/{1.73_m2} (ref >=60)
GFR, Est Non African American: 63 mL/min/{1.73_m2} (ref >=60)
Globulin: 2.8 g/dL (calc) (ref 1.9–3.7)
Glucose, Bld: 115 mg/dL — ABNORMAL HIGH (ref 65–99)
Potassium: 3.6 mmol/L (ref 3.5–5.3)
Sodium: 139 mmol/L (ref 135–146)
Total Bilirubin: 0.3 mg/dL (ref 0.2–1.2)
Total Protein: 7.1 g/dL (ref 6.1–8.1)

## 2020-03-06 LAB — POCT URINALYSIS DIPSTICK
Bilirubin, UA: NEGATIVE
Blood, UA: NEGATIVE
Glucose, UA: NEGATIVE
Ketones, UA: NEGATIVE
Leukocytes, UA: NEGATIVE
Nitrite, UA: NEGATIVE
Protein, UA: NEGATIVE
Spec Grav, UA: 1.01 (ref 1.010–1.025)
Urobilinogen, UA: 0.2 E.U./dL
pH, UA: 6.5 (ref 5.0–8.0)

## 2020-03-06 LAB — POCT UA - MICROALBUMIN
Albumin/Creatinine Ratio, Urine, POC: <30
Creatinine, POC: 100 mg/dL
Microalbumin Ur, POC: 10 mg/L

## 2020-03-06 NOTE — Patient Instructions (Addendum)
Remember to take telmisartan/hctz and amlodipine at different times. '  Health Maintenance, Female Adopting a healthy lifestyle and getting preventive care are important in promoting health and wellness. Ask your health care provider about:  The right schedule for you to have regular tests and exams.  Things you can do on your own to prevent diseases and keep yourself healthy. What should I know about diet, weight, and exercise? Eat a healthy diet   Eat a diet that includes plenty of vegetables, fruits, low-fat dairy products, and lean protein.  Do not eat a lot of foods that are high in solid fats, added sugars, or sodium. Maintain a healthy weight Body mass index (BMI) is used to identify weight problems. It estimates body fat based on height and weight. Your health care provider can help determine your BMI and help you achieve or maintain a healthy weight. Get regular exercise Get regular exercise. This is one of the most important things you can do for your health. Most adults should:  Exercise for at least 150 minutes each week. The exercise should increase your heart rate and make you sweat (moderate-intensity exercise).  Do strengthening exercises at least twice a week. This is in addition to the moderate-intensity exercise.  Spend less time sitting. Even light physical activity can be beneficial. Watch cholesterol and blood lipids Have your blood tested for lipids and cholesterol at 63 years of age, then have this test every 5 years. Have your cholesterol levels checked more often if:  Your lipid or cholesterol levels are high.  You are older than 63 years of age.  You are at high risk for heart disease. What should I know about cancer screening? Depending on your health history and family history, you may need to have cancer screening at various ages. This may include screening for:  Breast cancer.  Cervical cancer.  Colorectal cancer.  Skin cancer.  Lung cancer.  What should I know about heart disease, diabetes, and high blood pressure? Blood pressure and heart disease  High blood pressure causes heart disease and increases the risk of stroke. This is more likely to develop in people who have high blood pressure readings, are of African descent, or are overweight.  Have your blood pressure checked: ? Every 3-5 years if you are 65-47 years of age. ? Every year if you are 22 years old or older. Diabetes Have regular diabetes screenings. This checks your fasting blood sugar level. Have the screening done:  Once every three years after age 77 if you are at a normal weight and have a low risk for diabetes.  More often and at a younger age if you are overweight or have a high risk for diabetes. What should I know about preventing infection? Hepatitis B If you have a higher risk for hepatitis B, you should be screened for this virus. Talk with your health care provider to find out if you are at risk for hepatitis B infection. Hepatitis C Testing is recommended for:  Everyone born from 1 through 1964.  Anyone with known risk factors for hepatitis C. Sexually transmitted infections (STIs)  Get screened for STIs, including gonorrhea and chlamydia, if: ? You are sexually active and are younger than 63 years of age. ? You are older than 63 years of age and your health care provider tells you that you are at risk for this type of infection. ? Your sexual activity has changed since you were last screened, and you are at increased  risk for chlamydia or gonorrhea. Ask your health care provider if you are at risk.  Ask your health care provider about whether you are at high risk for HIV. Your health care provider may recommend a prescription medicine to help prevent HIV infection. If you choose to take medicine to prevent HIV, you should first get tested for HIV. You should then be tested every 3 months for as long as you are taking the medicine. Pregnancy   If you are about to stop having your period (premenopausal) and you may become pregnant, seek counseling before you get pregnant.  Take 400 to 800 micrograms (mcg) of folic acid every day if you become pregnant.  Ask for birth control (contraception) if you want to prevent pregnancy. Osteoporosis and menopause Osteoporosis is a disease in which the bones lose minerals and strength with aging. This can result in bone fractures. If you are 74 years old or older, or if you are at risk for osteoporosis and fractures, ask your health care provider if you should:  Be screened for bone loss.  Take a calcium or vitamin D supplement to lower your risk of fractures.  Be given hormone replacement therapy (HRT) to treat symptoms of menopause. Follow these instructions at home: Lifestyle  Do not use any products that contain nicotine or tobacco, such as cigarettes, e-cigarettes, and chewing tobacco. If you need help quitting, ask your health care provider.  Do not use street drugs.  Do not share needles.  Ask your health care provider for help if you need support or information about quitting drugs. Alcohol use  Do not drink alcohol if: ? Your health care provider tells you not to drink. ? You are pregnant, may be pregnant, or are planning to become pregnant.  If you drink alcohol: ? Limit how much you use to 0-1 drink a day. ? Limit intake if you are breastfeeding.  Be aware of how much alcohol is in your drink. In the [place] one drink equals one 12 oz bottle of beer (355 mL), one 5 oz glass of wine (148 mL), or one 1 oz glass of hard liquor (44 mL). General instructions  Schedule regular health, dental, and eye exams.  Stay current with your vaccines.  Tell your health care provider if: ? You often feel depressed. ? You have ever been abused or do not feel safe at home. Summary  Adopting a healthy lifestyle and getting preventive care are important in promoting health and  wellness.  Follow your health care provider's instructions about healthy diet, exercising, and getting tested or screened for diseases.  Follow your health care provider's instructions on monitoring your cholesterol and blood pressure. This information is not intended to replace advice given to you by your health care provider. Make sure you discuss any questions you have with your health care provider. Document Revised: 09/23/2018 Document Reviewed: 09/23/2018 Elsevier Patient Education  2020 [employer].

## 2020-03-06 NOTE — Progress Notes (Signed)
This visit occurred during the SARS-CoV-2 public health emergency.  Safety protocols were in place, including screening questions prior to the visit, additional usage of staff PPE, and extensive cleaning of exam room while observing appropriate contact time as indicated for disinfecting solutions.  Subjective:     Patient ID: Shannon Mendoza , female    DOB: 01/21/1956 , 64 y.o.   MRN: [id number]   Chief Complaint  Patient presents with  . Annual Exam  . Hypertension    HPI  She is here today for a full physical exam. She wants referral to GYN. She wants to be referred to [facility]. She wants to leave current GYN since he has gotten older.   Hypertension This is a chronic problem. The current episode started more than 1 year ago. The problem has been gradually improving since onset. The problem is controlled. Pertinent negatives include no blurred vision, chest pain, headaches, neck pain, orthopnea, palpitations or shortness of breath. Risk factors for coronary artery disease include sedentary lifestyle, obesity and post-menopausal state.     Past Medical History:  Diagnosis Date  . HTN (hypertension)   . Obesity      Family History  Problem Relation Age of Onset  . Hypertension Mother   . Diabetes Mother   . Cancer Mother   . Stroke Father   . Cancer Sister      Current Outpatient Medications:  .  amLODipine (NORVASC) 5 MG tablet, TAKE 1 TABLET BY MOUTH EVERY DAY, Disp: 90 tablet, Rfl: 1 .  Ascorbic Acid (VITAMIN C PO), Take by mouth., Disp: , Rfl:  .  Cholecalciferol (VITAMIN D-3) 125 MCG (5000 UT) TABS, Take 1 tablet by mouth daily., Disp: , Rfl:  .  telmisartan-hydrochlorothiazide (MICARDIS HCT) 80-25 MG tablet, TAKE 1 TABLET BY MOUTH EVERY DAY, Disp: 90 tablet, Rfl: 0 .  Probiotic Product (PROBIOTIC PO), Take by mouth., Disp: , Rfl:    Allergies  Allergen Reactions  . Codeine Nausea And Vomiting     The patient states she uses post menopausal status for  birth control. Last LMP was No LMP recorded. Patient is postmenopausal.. Negative for Dysmenorrhea  Negative for: breast discharge, breast lump(s), breast pain and breast self exam. Associated symptoms include abnormal vaginal bleeding. Pertinent negatives include abnormal bleeding (hematology), anxiety, decreased libido, depression, difficulty falling sleep, dyspareunia, history of infertility, nocturia, sexual dysfunction, sleep disturbances, urinary incontinence, urinary urgency, vaginal discharge and vaginal itching. Diet regular.The patient states her exercise level is  intermittent.   . The patient's tobacco use is:  Social History   Tobacco Use  Smoking Status Never Smoker  Smokeless Tobacco Never Used  . She has been exposed to passive smoke. The patient's alcohol use is:  Social History   Substance and Sexual Activity  Alcohol Use Not Currently    Review of Systems  Constitutional: Negative.   HENT: Negative.   Eyes: Negative.  Negative for blurred vision.  Respiratory: Negative.  Negative for shortness of breath.   Cardiovascular: Negative.  Negative for chest pain, palpitations and orthopnea.  Endocrine: Negative.   Genitourinary: Negative.   Musculoskeletal: Positive for arthralgias. Negative for neck pain.       She c/o six month h/o shoulder pain. Unable to sleep on her right side. She is unable to complete ergonomic exercises she has to perform at work. 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 She also c/o right wrist pain, started about a month ago. Denies fall/trauma.   Skin: Negative.  Allergic/Immunologic: Negative.   Neurological: Negative.  Negative for headaches.  Hematological: Negative.   Psychiatric/Behavioral: Negative.      Today's Vitals   03/06/20 0938  BP: (!) 142/100  Pulse: 90  Temp: 98 F (36.7 C)  TempSrc: Oral  Weight: 216 lb 12.8 oz (98.3 kg)  Height: 5' 5.8" (1.671 m)   Body mass index is 35.21 kg/m.   Objective:  Physical Exam Vitals and  nursing note reviewed.  Constitutional:      Appearance: Normal appearance.  HENT:     Head: Normocephalic and atraumatic.     Right Ear: Tympanic membrane, ear canal and external ear normal.     Left Ear: Tympanic membrane, ear canal and external ear normal.     Nose:     Comments: Deferred, masked    Mouth/Throat:     Comments: Deferred, masked Eyes:     Extraocular Movements: Extraocular movements intact.     Conjunctiva/sclera: Conjunctivae normal.     Pupils: Pupils are equal, round, and reactive to light.  Cardiovascular:     Rate and Rhythm: Normal rate and regular rhythm.     Pulses: Normal pulses.     Heart sounds: Normal heart sounds.  Pulmonary:     Effort: Pulmonary effort is normal.     Breath sounds: Normal breath sounds.  Chest:     Breasts: Tanner Score is 5.        Right: Normal.        Left: Normal.  Abdominal:     General: Bowel sounds are normal.     Palpations: Abdomen is soft.     Comments: Rounded, soft  Genitourinary:    Comments: deferred Musculoskeletal:        General: Normal range of motion.     Cervical back: Normal range of motion and neck supple.     Comments: Neg Tinel's and neg Phalen's sign  Decreased ROM of r shoulder, pain w/ movement  Skin:    General: Skin is warm and dry.  Neurological:     General: No focal deficit present.     Mental Status: She is alert and oriented to person, place, and time.  Psychiatric:        Mood and Affect: Mood normal.        Behavior: Behavior normal.         Assessment And Plan:     1. Routine general medical examination at health care facility  A full exam was performed.  Importance of monthly self breast exams was discussed with the patient.  PATIENT IS ADVISED TO GET 30-45 MINUTES REGULAR EXERCISE NO LESS THAN FOUR TO FIVE DAYS PER WEEK - BOTH WEIGHTBEARING EXERCISES AND AEROBIC ARE RECOMMENDED.  SHE IS ADVISED TO FOLLOW A HEALTHY DIET WITH AT LEAST SIX FRUITS/VEGGIES PER DAY, DECREASE INTAKE  OF RED MEAT, AND TO INCREASE FISH INTAKE TO TWO DAYS PER WEEK.  MEATS/FISH SHOULD NOT BE FRIED, BAKED OR BROILED IS PREFERABLE.  I SUGGEST WEARING SPF 50 SUNSCREEN ON EXPOSED PARTS AND ESPECIALLY WHEN IN THE DIRECT SUNLIGHT FOR AN EXTENDED PERIOD OF TIME.  PLEASE AVOID FAST FOOD RESTAURANTS AND INCREASE YOUR WATER INTAKE.   - CMP14+EGFR - CBC - Lipid panel - Hemoglobin A1c - Ambulatory referral to Gynecology  2. Hypertensive nephropathy  Chronic, uncontrolled. She is advised to take telmisartan/hctz upon getting up for work and amlodipine at bedtime. She agrees to rto in 3 weeks for a nurse visit. If needed, I will increase  amlodipine to 38m daily at that time. EKG performed, NSR w/o acute changes.   - POCT Urinalysis Dipstick (81002) - POCT UA - Microalbumin - EKG 12-Lead  3. Chronic renal disease, stage II  Chronic, I will check renal function today. She is encouraged to stay well hydrated and incorporate healthy lifestyle changes to help improve her BP control.   4. Right wrist pain  I will refer her to Ortho as requested. She will continue to wear wrist brace while at work.   - Ambulatory referral to Orthopedic Surgery  5. Chronic right shoulder pain  Possibly due to rotator cuff strain, I will refer her to Ortho as requested.   - Ambulatory referral to Orthopedic Surgery  6. Paresthesia and pain of right extremity  She has h/o vitamin B12 deficiency. I will make further recommendations once her labs are available for review.   - Vitamin B12   RMaximino Greenland MD    THE PATIENT IS ENCOURAGED TO PRACTICE SOCIAL DISTANCING DUE TO THE COVID-19 PANDEMIC.

## 2020-03-07 LAB — CBC
HCT: 37.9 % (ref 35.0–45.0)
Hemoglobin: 12.4 g/dL (ref 11.7–15.5)
MCH: 27.9 pg (ref 27.0–33.0)
MCHC: 32.7 g/dL (ref 32.0–36.0)
MCV: 85.2 fL (ref 80.0–100.0)
MPV: 11.3 fL (ref 7.5–12.5)
Platelets: 294 10*3/uL (ref 140–400)
RBC: 4.45 10*6/uL (ref 3.80–5.10)
RDW: 13.3 % (ref 11.0–15.0)
WBC: 5.2 10*3/uL (ref 3.8–10.8)

## 2020-03-07 LAB — VITAMIN B12: Vitamin B-12: 215 pg/mL (ref 200–1100)

## 2020-03-07 LAB — LIPID PANEL
Cholesterol: 212 mg/dL — ABNORMAL HIGH (ref <200)
HDL: 49 mg/dL — ABNORMAL LOW (ref >=50)
LDL Cholesterol (Calc): 137 mg/dL (calc) — ABNORMAL HIGH
Non-HDL Cholesterol (Calc): 163 mg/dL (calc) — ABNORMAL HIGH (ref <130)
Total CHOL/HDL Ratio: 4.3 (calc) (ref <5.0)
Triglycerides: 131 mg/dL (ref <150)

## 2020-03-07 LAB — HEMOGLOBIN A1C
Hgb A1c MFr Bld: 6.2 % of total Hgb — ABNORMAL HIGH (ref <5.7)
Mean Plasma Glucose: 131 (calc)
eAG (mmol/L): 7.3 (calc)

## 2020-03-09 ENCOUNTER — Ambulatory Visit (INDEPENDENT_AMBULATORY_CARE_PROVIDER_SITE_OTHER): Payer: 59

## 2020-03-09 ENCOUNTER — Other Ambulatory Visit: Payer: Self-pay

## 2020-03-09 VITALS — BP 130/76 | HR 71 | Temp 97.8°F | Ht 65.8 in | Wt 216.4 lb

## 2020-03-09 DIAGNOSIS — E538 Deficiency of other specified B group vitamins: Secondary | ICD-10-CM | POA: Diagnosis not present

## 2020-03-09 MED ORDER — CYANOCOBALAMIN 1000 MCG/ML IJ SOLN
1000.0000 ug | Freq: Once | INTRAMUSCULAR | Status: AC
  Administered 2020-03-09: 1000 ug via INTRAMUSCULAR

## 2020-03-09 NOTE — Progress Notes (Signed)
PT IS BEING SEEN TODAY FOR A B12 INJECTION FOR B12 DEFICIENCY.

## 2020-03-16 ENCOUNTER — Ambulatory Visit: Payer: 59 | Admitting: Internal Medicine

## 2020-03-16 ENCOUNTER — Telehealth: Payer: Self-pay

## 2020-03-16 ENCOUNTER — Ambulatory Visit (INDEPENDENT_AMBULATORY_CARE_PROVIDER_SITE_OTHER): Payer: 59

## 2020-03-16 ENCOUNTER — Other Ambulatory Visit: Payer: Self-pay

## 2020-03-16 VITALS — BP 116/78 | HR 91 | Temp 97.9°F | Ht 65.8 in | Wt 216.0 lb

## 2020-03-16 DIAGNOSIS — E538 Deficiency of other specified B group vitamins: Secondary | ICD-10-CM | POA: Diagnosis not present

## 2020-03-16 MED ORDER — CYANOCOBALAMIN 1000 MCG/ML IJ SOLN
1000.0000 ug | Freq: Once | INTRAMUSCULAR | Status: AC
  Administered 2020-03-16: 1000 ug via INTRAMUSCULAR

## 2020-03-16 NOTE — Progress Notes (Signed)
Pt presents today for B-12 injection 

## 2020-03-16 NOTE — Telephone Encounter (Signed)
I called the pt and left her a message that her appt for today at 4:45 was changed from Dr Allyne Gee to [place], [place] because Dr. Allyne Gee is unable to come in today.

## 2020-03-23 ENCOUNTER — Other Ambulatory Visit: Payer: Self-pay

## 2020-03-23 ENCOUNTER — Encounter: Payer: Self-pay | Admitting: Internal Medicine

## 2020-03-23 ENCOUNTER — Ambulatory Visit (INDEPENDENT_AMBULATORY_CARE_PROVIDER_SITE_OTHER): Payer: 59 | Admitting: Internal Medicine

## 2020-03-23 VITALS — BP 122/80 | HR 70 | Temp 98.0°F | Ht 65.8 in | Wt 216.2 lb

## 2020-03-23 DIAGNOSIS — E538 Deficiency of other specified B group vitamins: Secondary | ICD-10-CM

## 2020-03-23 DIAGNOSIS — R400 Somnolence: Secondary | ICD-10-CM

## 2020-03-23 DIAGNOSIS — I129 Hypertensive chronic kidney disease with stage 1 through stage 4 chronic kidney disease, or unspecified chronic kidney disease: Secondary | ICD-10-CM | POA: Diagnosis not present

## 2020-03-23 DIAGNOSIS — N182 Chronic kidney disease, stage 2 (mild): Secondary | ICD-10-CM

## 2020-03-23 DIAGNOSIS — K146 Glossodynia: Secondary | ICD-10-CM

## 2020-03-23 MED ORDER — CYANOCOBALAMIN 1000 MCG/ML IJ SOLN
1000.0000 ug | Freq: Once | INTRAMUSCULAR | Status: AC
  Administered 2020-03-23: 1000 ug via INTRAMUSCULAR

## 2020-03-23 NOTE — Progress Notes (Signed)
This visit occurred during the SARS-CoV-2 public health emergency.  Safety protocols were in place, including screening questions prior to the visit, additional usage of staff PPE, and extensive cleaning of exam room while observing appropriate contact time as indicated for disinfecting solutions.  Subjective:     Patient ID: Shannon Mendoza , female    DOB: [birth year] , 64 y.o.   MRN: [id number]   Chief Complaint  Patient presents with  . B12 Injection    HPI  She is here today for f/u vitamin B12. She has yet to notice change in her energy levels. However, she does admit that her tongue had started to "feel funny". She has had this sensation in the past. She also reports her sister requires B12 injections as well.     Past Medical History:  Diagnosis Date  . HTN (hypertension)   . Obesity      Family History  Problem Relation Age of Onset  . Hypertension Mother   . Diabetes Mother   . Cancer Mother   . Stroke Father   . Cancer Sister      Current Outpatient Medications:  .  amLODipine (NORVASC) 5 MG tablet, TAKE 1 TABLET BY MOUTH EVERY DAY, Disp: 90 tablet, Rfl: 1 .  Ascorbic Acid (VITAMIN C PO), Take by mouth., Disp: , Rfl:  .  Cholecalciferol (VITAMIN D-3) 125 MCG (5000 UT) TABS, Take 1 tablet by mouth daily., Disp: , Rfl:  .  telmisartan-hydrochlorothiazide (MICARDIS HCT) 80-25 MG tablet, TAKE 1 TABLET BY MOUTH EVERY DAY, Disp: 90 tablet, Rfl: 0   Allergies  Allergen Reactions  . Codeine Nausea And Vomiting     Review of Systems  Constitutional: Negative.   HENT:       C/o tongue pain  Respiratory: Negative.   Cardiovascular: Negative.   Gastrointestinal: Negative.   Neurological: Negative.   Psychiatric/Behavioral: Negative.      Today's Vitals   03/23/20 1607  BP: 122/80  Pulse: 70  Temp: 98 F (36.7 C)  TempSrc: Oral  Weight: 216 lb 3.2 oz (98.1 kg)  Height: 5' 5.8" (1.671 m)   Body mass index is 35.11 kg/m.   Objective:  Physical  Exam Vitals and nursing note reviewed.  Constitutional:      Appearance: Normal appearance.  HENT:     Head: Normocephalic and atraumatic.  Cardiovascular:     Rate and Rhythm: Normal rate and regular rhythm.     Heart sounds: Normal heart sounds.  Pulmonary:     Effort: Pulmonary effort is normal.     Breath sounds: Normal breath sounds.  Skin:    General: Skin is warm.  Neurological:     General: No focal deficit present.     Mental Status: She is alert.  Psychiatric:        Mood and Affect: Mood normal.        Behavior: Behavior normal.         Assessment And Plan:     1. B12 deficiency  She was given B12 IM x1. She will rto next two weeks for weekly injection, then start monthly injections.   - cyanocobalamin ((VITAMIN B-12)) injection 1,000 mcg  2. Glossodynia  Pt advised these sx are related to her B12 deficiency.   3. Hypertensive nephropathy  Chronic, well controlled. She will continue with current meds.   4. Chronic renal disease, stage II  Chronic, this has been stable.   5. Daytime somnolence  I suggested consideration of sleep  study. She declines at this time. I will revisit at her next visit.   Gwynneth Aliment, MD    THE PATIENT IS ENCOURAGED TO PRACTICE SOCIAL DISTANCING DUE TO THE COVID-19 PANDEMIC.

## 2020-03-23 NOTE — Patient Instructions (Signed)
Vitamin B12 Deficiency Vitamin B12 deficiency occurs when the body does not have enough vitamin B12, which is an important vitamin. The body needs this vitamin:  To make red blood cells.  To make DNA. This is the genetic material inside cells.  To help the nerves work properly so they can carry messages from the brain to the body. Vitamin B12 deficiency can cause various health problems, such as a low red blood cell count (anemia) or nerve damage. What are the causes? This condition may be caused by:  Not eating enough foods that contain vitamin B12.  Not having enough stomach acid and digestive fluids to properly absorb vitamin B12 from the food that you eat.  Certain digestive system diseases that make it hard to absorb vitamin B12. These diseases include Crohn's disease, chronic pancreatitis, and cystic fibrosis.  A condition in which the body does not make enough of a protein (intrinsic factor), resulting in too few red blood cells (pernicious anemia).  Having a surgery in which part of the stomach or small intestine is removed.  Taking certain medicines that make it hard for the body to absorb vitamin B12. These medicines include: ? Heartburn medicines (antacids and proton pump inhibitors). ? Certain antibiotic medicines. ? Some medicines that are used to treat diabetes, tuberculosis, gout, or high cholesterol. What increases the risk? The following factors may make you more likely to develop a B12 deficiency:  Being older than age 50.  Eating a vegetarian or vegan diet, especially while you are pregnant.  Eating a poor diet while you are pregnant.  Taking certain medicines.  Having alcoholism. What are the signs or symptoms? In some cases, there are no symptoms of this condition. If the condition leads to anemia or nerve damage, various symptoms can occur, such as:  Weakness.  Fatigue.  Loss of appetite.  Weight loss.  Numbness or tingling in your hands and  feet.  Redness and burning of the tongue.  Confusion or memory problems.  Depression.  Sensory problems, such as color blindness, ringing in the ears, or loss of taste.  Diarrhea or constipation.  Trouble walking. If anemia is severe, symptoms can include:  Shortness of breath.  Dizziness.  Rapid heart rate (tachycardia). How is this diagnosed? This condition may be diagnosed with a blood test to measure the level of vitamin B12 in your blood. You may also have other tests, including:  A group of tests that measure certain characteristics of blood cells (complete blood count, CBC).  A blood test to measure intrinsic factor.  A procedure where a thin tube with a camera on the end is used to look into your stomach or intestines (endoscopy). Other tests may be needed to discover the cause of B12 deficiency. How is this treated? Treatment for this condition depends on the cause. This condition may be treated by:  Changing your eating and drinking habits, such as: ? Eating more foods that contain vitamin B12. ? Drinking less alcohol or no alcohol.  Getting vitamin B12 injections.  Taking vitamin B12 supplements. Your health care provider will tell you which dosage is best for you. Follow these instructions at home: Eating and drinking   Eat lots of healthy foods that contain vitamin B12, including: ? Meats and poultry. This includes beef, pork, chicken, turkey, and organ meats, such as liver. ? Seafood. This includes clams, rainbow trout, salmon, tuna, and haddock. ? Eggs. ? Cereal and dairy products that are fortified. This means that vitamin B12   has been added to the food. Check the label on the package to see if the food is fortified. The items listed above may not be a complete list of recommended foods and beverages. Contact a dietitian for more information. General instructions  Get any injections that are prescribed by your health care provider.  Take  supplements only as told by your health care provider. Follow the directions carefully.  Do not drink alcohol if your health care provider tells you not to. In some cases, you may only be asked to limit alcohol use.  Keep all follow-up visits as told by your health care provider. This is important. Contact a health care provider if:  Your symptoms come back. Get help right away if you:  Develop shortness of breath.  Have a rapid heart rate.  Have chest pain.  Become dizzy or lose consciousness. Summary  Vitamin B12 deficiency occurs when the body does not have enough vitamin B12.  The main causes of vitamin B12 deficiency include dietary deficiency, digestive diseases, pernicious anemia, and having a surgery in which part of the stomach or small intestine is removed.  In some cases, there are no symptoms of this condition. If the condition leads to anemia or nerve damage, various symptoms can occur, such as weakness, shortness of breath, and numbness.  Treatment may include getting vitamin B12 injections or taking vitamin B12 supplements. Eat lots of healthy foods that contain vitamin B12. This information is not intended to replace advice given to you by your health care provider. Make sure you discuss any questions you have with your health care provider. Document Revised: 03/19/2019 Document Reviewed: 06/09/2018 Elsevier Patient Education  2020 [employer]  

## 2020-03-27 ENCOUNTER — Ambulatory Visit: Payer: 59

## 2020-03-28 ENCOUNTER — Other Ambulatory Visit: Payer: Self-pay

## 2020-03-28 ENCOUNTER — Ambulatory Visit (INDEPENDENT_AMBULATORY_CARE_PROVIDER_SITE_OTHER): Payer: 59

## 2020-03-28 VITALS — BP 122/76 | HR 66 | Temp 98.0°F | Ht 66.2 in | Wt 217.0 lb

## 2020-03-28 DIAGNOSIS — E538 Deficiency of other specified B group vitamins: Secondary | ICD-10-CM

## 2020-03-28 DIAGNOSIS — I10 Essential (primary) hypertension: Secondary | ICD-10-CM

## 2020-03-28 MED ORDER — CYANOCOBALAMIN 1000 MCG/ML IJ SOLN
1000.0000 ug | Freq: Once | INTRAMUSCULAR | Status: AC
  Administered 2020-03-28: 1000 ug via INTRAMUSCULAR

## 2020-03-28 NOTE — Progress Notes (Signed)
Patient presents today for a blood pressure check and for a vitamin B12. Patient's blood pressure was 122/76 pulse 66. YL,RMA

## 2020-04-04 ENCOUNTER — Ambulatory Visit (INDEPENDENT_AMBULATORY_CARE_PROVIDER_SITE_OTHER): Payer: 59

## 2020-04-04 ENCOUNTER — Other Ambulatory Visit: Payer: Self-pay

## 2020-04-04 VITALS — BP 130/80 | HR 97 | Temp 97.9°F | Ht 66.2 in | Wt 217.0 lb

## 2020-04-04 DIAGNOSIS — E538 Deficiency of other specified B group vitamins: Secondary | ICD-10-CM | POA: Diagnosis not present

## 2020-04-04 MED ORDER — CYANOCOBALAMIN 1000 MCG/ML IJ SOLN
1000.0000 ug | Freq: Once | INTRAMUSCULAR | Status: AC
  Administered 2020-04-04: 1000 ug via INTRAMUSCULAR

## 2020-04-04 NOTE — Progress Notes (Signed)
Pt presents for b-12 today

## 2020-04-06 ENCOUNTER — Encounter (HOSPITAL_BASED_OUTPATIENT_CLINIC_OR_DEPARTMENT_OTHER): Payer: Self-pay | Admitting: *Deleted

## 2020-04-06 ENCOUNTER — Emergency Department (HOSPITAL_BASED_OUTPATIENT_CLINIC_OR_DEPARTMENT_OTHER): Payer: 59

## 2020-04-06 ENCOUNTER — Other Ambulatory Visit: Payer: Self-pay

## 2020-04-06 DIAGNOSIS — Z79899 Other long term (current) drug therapy: Secondary | ICD-10-CM | POA: Diagnosis not present

## 2020-04-06 DIAGNOSIS — R0789 Other chest pain: Secondary | ICD-10-CM | POA: Insufficient documentation

## 2020-04-06 DIAGNOSIS — I10 Essential (primary) hypertension: Secondary | ICD-10-CM | POA: Insufficient documentation

## 2020-04-06 DIAGNOSIS — R079 Chest pain, unspecified: Secondary | ICD-10-CM | POA: Diagnosis present

## 2020-04-06 LAB — CBC
HCT: 39.2 % (ref 36.0–46.0)
Hemoglobin: 12.3 g/dL (ref 12.0–15.0)
MCH: 27.8 pg (ref 26.0–34.0)
MCHC: 31.4 g/dL (ref 30.0–36.0)
MCV: 88.5 fL (ref 80.0–100.0)
Platelets: 263 10*3/uL (ref 150–400)
RBC: 4.43 MIL/uL (ref 3.87–5.11)
RDW: 13.6 % (ref 11.5–15.5)
WBC: 5.6 10*3/uL (ref 4.0–10.5)
nRBC: 0 % (ref 0.0–0.2)

## 2020-04-06 LAB — BASIC METABOLIC PANEL
Anion gap: 12 (ref 5–15)
BUN: 18 mg/dL (ref 8–23)
CO2: 28 mmol/L (ref 22–32)
Calcium: 9.5 mg/dL (ref 8.9–10.3)
Chloride: 102 mmol/L (ref 98–111)
Creatinine, Ser: 0.94 mg/dL (ref 0.44–1.00)
GFR calc Af Amer: >60 mL/min (ref >60)
GFR calc non Af Amer: >60 mL/min (ref >60)
Glucose, Bld: 117 mg/dL — ABNORMAL HIGH (ref 70–99)
Potassium: 3.4 mmol/L — ABNORMAL LOW (ref 3.5–5.1)
Sodium: 142 mmol/L (ref 135–145)

## 2020-04-06 LAB — TROPONIN I (HIGH SENSITIVITY): Troponin I (High Sensitivity): 5 ng/L (ref <18)

## 2020-04-06 MED ORDER — SODIUM CHLORIDE 0.9% FLUSH
3.0000 mL | Freq: Once | INTRAVENOUS | Status: DC
  Filled 2020-04-06: qty 3

## 2020-04-06 NOTE — ED Triage Notes (Signed)
Pain in the center of her chest this am that went away. It came back tonight.

## 2020-04-07 ENCOUNTER — Emergency Department (HOSPITAL_BASED_OUTPATIENT_CLINIC_OR_DEPARTMENT_OTHER)
Admission: EM | Admit: 2020-04-07 | Discharge: 2020-04-07 | Disposition: A | Discharge status: Home / Self Care | Payer: 59 | Attending: Emergency Medicine | Admitting: Emergency Medicine

## 2020-04-07 DIAGNOSIS — R0789 Other chest pain: Secondary | ICD-10-CM

## 2020-04-07 LAB — TROPONIN I (HIGH SENSITIVITY): Troponin I (High Sensitivity): 5 ng/L (ref <18)

## 2020-04-07 NOTE — ED Provider Notes (Signed)
[facility] Provider Note: Shannon Dell, MD, FACEP  CSN: [id number] MRN: [id number] ARRIVAL: 04/06/20 at 2123 ROOM: [facility]   CHIEF COMPLAINT  Chest Pain   HISTORY OF PRESENT ILLNESS  04/07/20 2:17 AM Shannon Mendoza is a 64 y.o. female with episodes of chest pain that began yesterday morning.  These episodes come at random and she has had several over the last 24 hours.  The pain is described as a burning sensation in the center of her chest that does not radiate.  She rates it as a 2 out of 10 in severity.  Nothing makes it better or worse and nothing brings these episodes on.  She has had no associated shortness of breath, cough, diaphoresis, nausea or vomiting.   Past Medical History:  Diagnosis Date  . HTN (hypertension)   . Obesity     Past Surgical History:  Procedure Laterality Date  . BREAST BIOPSY Left over 10 years ago   benign    Family History  Problem Relation Age of Onset  . Hypertension Mother   . Diabetes Mother   . Cancer Mother   . Stroke Father   . Cancer Sister     Social History   Tobacco Use  . Smoking status: Never Smoker  . Smokeless tobacco: Never Used  Vaping Use  . Vaping Use: Never used  Substance Use Topics  . Alcohol use: Not Currently  . Drug use: Never    Prior to Admission medications   Medication Sig Start Date End Date Taking? Authorizing Provider  amLODipine (NORVASC) 5 MG tablet TAKE 1 TABLET BY MOUTH EVERY DAY 04/27/19  Yes Dorothyann Peng, MD  Ascorbic Acid (VITAMIN C PO) Take by mouth.   Yes [provider]  Cholecalciferol (VITAMIN D-3) 125 MCG (5000 UT) TABS Take 1 tablet by mouth daily.   Yes [provider]  telmisartan-hydrochlorothiazide (MICARDIS HCT) 80-25 MG tablet TAKE 1 TABLET BY MOUTH EVERY DAY 01/25/20  Yes Dorothyann Peng, MD    Allergies Codeine   REVIEW OF SYSTEMS  Negative except as noted here or in the History of Present Illness.   PHYSICAL EXAMINATION    Initial Vital Signs Blood pressure 136/89, pulse 67, temperature 98.1 F (36.7 C), temperature source Oral, resp. rate (!) 23, height 5' 6.25" (1.683 m), weight 98.4 kg, SpO2 99 %.  Examination General: Well-developed, well-nourished female in no acute distress; appearance consistent with age of record HENT: normocephalic; atraumatic Eyes: pupils equal, round and reactive to light; extraocular muscles intact Neck: supple Heart: regular rate and rhythm; no murmur Lungs: clear to auscultation bilaterally Abdomen: soft; nondistended; nontender; bowel sounds present Extremities: No deformity; full range of motion; pulses normal Neurologic: Awake, alert and oriented; motor function intact in all extremities and symmetric; no facial droop Skin: Warm and dry Psychiatric: Normal mood and affect   RESULTS  Summary of this visit's results, reviewed and interpreted by myself:   EKG Interpretation  Date/Time:  Thursday April 06 2020 21:29:25 EDT Ventricular Rate:  79 PR Interval:  172 QRS Duration: 84 QT Interval:  366 QTC Calculation: 419 R Axis:   77 Text Interpretation: Normal sinus rhythm Nonspecific T wave abnormality Abnormal ECG No previous ECGs available Confirmed by Cheryl Chay, Jonny Ruiz ([id number]) on 04/07/2020 1:30:39 AM      Laboratory Studies: Results for orders placed or performed during the hospital encounter of 04/07/20 (from the past 24 hour(s))  Basic metabolic panel     Status: Abnormal   Collection  Time: 04/06/20  9:33 PM  Result Value Ref Range   Sodium 142 135 - 145 mmol/L   Potassium 3.4 (L) 3.5 - 5.1 mmol/L   Chloride 102 98 - 111 mmol/L   CO2 28 22 - 32 mmol/L   Glucose, Bld 117 (H) 70 - 99 mg/dL   BUN 18 8 - 23 mg/dL   Creatinine, Ser 0.94 0.44 - 1.00 mg/dL   Calcium 9.5 8.9 - 10.3 mg/dL   GFR calc non Af Amer >60 >60 mL/min   GFR calc Af Amer >60 >60 mL/min   Anion gap 12 5 - 15  CBC     Status: None   Collection Time: 04/06/20  9:33 PM  Result Value Ref Range    WBC 5.6 4.0 - 10.5 K/uL   RBC 4.43 3.87 - 5.11 MIL/uL   Hemoglobin 12.3 12.0 - 15.0 g/dL   HCT 39.2 36 - 46 %   MCV 88.5 80.0 - 100.0 fL   MCH 27.8 26.0 - 34.0 pg   MCHC 31.4 30.0 - 36.0 g/dL   RDW 13.6 11.5 - 15.5 %   Platelets 263 150 - 400 K/uL   nRBC 0.0 0.0 - 0.2 %  Troponin I (High Sensitivity)     Status: None   Collection Time: 04/06/20  9:33 PM  Result Value Ref Range   Troponin I (High Sensitivity) 5 <18 ng/L  Troponin I (High Sensitivity)     Status: None   Collection Time: 04/07/20  1:34 AM  Result Value Ref Range   Troponin I (High Sensitivity) 5 <18 ng/L   Imaging Studies: DG Chest 2 View  Result Date: 04/06/2020 CLINICAL DATA:  Chest pain EXAM: CHEST - 2 VIEW COMPARISON:  Radiograph 08/12/2013 FINDINGS: Low lung volumes and atelectasis. Mild central vascular crowding. No consolidation, features of edema, pneumothorax, or effusion. Pulmonary vascularity is normally distributed. The cardiomediastinal contours are unremarkable. No acute osseous or soft tissue abnormality. IMPRESSION: Mild basilar atelectasis. No other acute cardiopulmonary abnormality. Electronically Signed   By: Lovena Le M.D.   On: 04/06/2020 22:28    ED COURSE and MDM  Nursing notes, initial and subsequent vitals signs, including pulse oximetry, reviewed and interpreted by myself.  Vitals:   04/06/20 2128 04/07/20 0127 04/07/20 0131 04/07/20 0200  BP: (!) 146/94 136/89 136/89 136/89  Pulse: 94 70 70 67  Resp: 20 20 18  (!) 23  Temp: 98.7 F (37.1 C)  98.1 F (36.7 C)   TempSrc: Oral  Oral   SpO2: 99% 100% 100% 99%  Weight:      Height:       Medications - No data to display  HAART score is 3 which is low risk for myocardial ischemia.  Her story is atypical for myocardial ischemia.  We will discharge her home with the recommendation she return if symptoms worsen.  Otherwise she will follow-up with her primary care physician.  PROCEDURES  Procedures   ED DIAGNOSES     ICD-10-CM    1. Atypical chest pain  R07.89        Shannon Rosser, MD 04/07/20 [phone]

## 2020-04-20 ENCOUNTER — Encounter: Payer: Self-pay | Admitting: Internal Medicine

## 2020-04-20 ENCOUNTER — Other Ambulatory Visit: Payer: Self-pay

## 2020-04-20 ENCOUNTER — Ambulatory Visit: Payer: 59 | Admitting: Internal Medicine

## 2020-04-20 VITALS — BP 126/94 | HR 97 | Temp 98.6°F | Ht 66.25 in | Wt 217.6 lb

## 2020-04-20 DIAGNOSIS — I129 Hypertensive chronic kidney disease with stage 1 through stage 4 chronic kidney disease, or unspecified chronic kidney disease: Secondary | ICD-10-CM | POA: Diagnosis not present

## 2020-04-20 DIAGNOSIS — K219 Gastro-esophageal reflux disease without esophagitis: Secondary | ICD-10-CM | POA: Diagnosis not present

## 2020-04-20 DIAGNOSIS — N182 Chronic kidney disease, stage 2 (mild): Secondary | ICD-10-CM | POA: Diagnosis not present

## 2020-04-20 DIAGNOSIS — R0789 Other chest pain: Secondary | ICD-10-CM

## 2020-04-20 DIAGNOSIS — E538 Deficiency of other specified B group vitamins: Secondary | ICD-10-CM

## 2020-04-20 MED ORDER — CYANOCOBALAMIN 1000 MCG/ML IJ SOLN
1000.0000 ug | Freq: Once | INTRAMUSCULAR | Status: AC
Start: 2020-04-20 — End: 2020-04-20
  Administered 2020-04-20: 1000 ug via INTRAMUSCULAR

## 2020-04-20 MED ORDER — OMEPRAZOLE 20 MG PO CPDR: 20.0000 mg | DELAYED_RELEASE_CAPSULE | Freq: Every day | ORAL | 1 refills | Status: DC

## 2020-04-21 NOTE — Patient Instructions (Signed)

## 2020-04-21 NOTE — Progress Notes (Signed)
This visit occurred during the SARS-CoV-2 public health emergency.  Safety protocols were in place, including screening questions prior to the visit, additional usage of staff PPE, and extensive cleaning of exam room while observing appropriate contact time as indicated for disinfecting solutions.  Subjective:     Patient ID: Shannon Mendoza , female    DOB: Jan 03, 1956 , 64 y.o.   MRN: [id number]   Chief Complaint  Patient presents with   ER f/u    HPI  Shannon Mendoza is a 64 y.o. female who presents for ER f/u. She presented to [facility] ER on 6/25 with episodes of chest pain that began morning prior to presentation.  These episodes come at random and she has had several over the last 24 hours.  The pain was described as a burning sensation in the center of her chest that did not radiate.  She rates it as a 2 out of 10 in severity.  Nothing makes it better or worse and nothing brings these episodes on.  She has had no associated shortness of breath, cough, diaphoresis, nausea or vomiting  Workup included EKG - NSR w/ T wave abnormality. She also had negative troponin. Her cardiac risk factors include ethnicity, age, postmenopausal status, obesity and high blood pressure.   HAART score was 3, not likely her sx are due to myocardial ischemia. She has not had any further episodes of chest discomfort since ER visit. She does admit to increased belching and abdominal bloating. Feels as if food gets stuck in her chest. Denies heartburn. No prior h/o GERD.       Past Medical History:  Diagnosis Date   HTN (hypertension)    Obesity      Family History  Problem Relation Age of Onset   Hypertension Mother    Diabetes Mother    Cancer Mother    Stroke Father    Cancer Sister      Current Outpatient Medications:    amLODipine (NORVASC) 5 MG tablet, TAKE 1 TABLET BY MOUTH EVERY DAY, Disp: 90 tablet, Rfl: 1   Cholecalciferol (VITAMIN D-3) 125 MCG (5000 UT) TABS, Take 1 tablet by  mouth daily., Disp: , Rfl:    Multiple Vitamins-Minerals (CENTRUM SILVER 50+WOMEN PO), Take by mouth., Disp: , Rfl:    telmisartan-hydrochlorothiazide (MICARDIS HCT) 80-25 MG tablet, TAKE 1 TABLET BY MOUTH EVERY DAY, Disp: 90 tablet, Rfl: 0   omeprazole (PRILOSEC) 20 MG capsule, Take 1 capsule (20 mg total) by mouth daily., Disp: 90 capsule, Rfl: 1   Allergies  Allergen Reactions   Codeine Nausea And Vomiting     Review of Systems  Constitutional: Negative.   Respiratory: Negative.   Cardiovascular: Negative.   Gastrointestinal: Negative.   Neurological: Negative.   Psychiatric/Behavioral: Negative.      Today's Vitals   04/20/20 1003  BP: (!) 126/94  Pulse: 97  Temp: 98.6 F (37 C)  TempSrc: Oral  Weight: 217 lb 9.6 oz (98.7 kg)  Height: 5' 6.25" (1.683 m)   Body mass index is 34.86 kg/m.   Objective:  Physical Exam Vitals and nursing note reviewed.  Constitutional:      Appearance: Normal appearance. She is obese.  HENT:     Head: Normocephalic and atraumatic.  Cardiovascular:     Rate and Rhythm: Normal rate and regular rhythm.     Heart sounds: Normal heart sounds.  Pulmonary:     Effort: Pulmonary effort is normal.     Breath sounds: Normal breath  sounds.  Skin:    General: Skin is warm.  Neurological:     General: No focal deficit present.     Mental Status: She is alert.  Psychiatric:        Mood and Affect: Mood normal.        Behavior: Behavior normal.         Assessment And Plan:     1. Atypical chest pain  ER records reviewed in full detail.   2. Hypertensive nephropathy  Chronic, fair control. She will continue with current meds for now. She is encouraged to avoid adding salt to her foods.   3. Chronic renal disease, stage II  Chronic, yet stable.   4. Gastroesophageal reflux disease without esophagitis  I believe this has contributed to her atypical chest pain. She agrees to start short-course of PPI, omeprazole, 20mg  once  daily. Risks of long-term use were discussed with the patient. I hope that I will be able to wean her off of meds after 60-90 days of treatment. Advised to take upon awakening, and to wait 30 minutes prior to eating. She is also encouraged to stop eating 3 hours prior to lying down. All questions were answered to her satisfaction.   5. B12 deficiency  She will continue with monthly B12 injections, this is especially important while on PPI therapy. She was given vitamin B12 IM x1.   - cyanocobalamin ((VITAMIN B-12)) injection 1,000 mcg      , MD   I, Gwynneth Aliment, MD, have reviewed all documentation for this visit. The documentation on 04/21/20 for the exam, diagnosis, procedures, and orders are all accurate and complete.  THE PATIENT IS ENCOURAGED TO PRACTICE SOCIAL DISTANCING DUE TO THE COVID-19 PANDEMIC.

## 2020-05-02 ENCOUNTER — Ambulatory Visit: Payer: 59 | Admitting: Internal Medicine

## 2020-05-14 ENCOUNTER — Other Ambulatory Visit: Payer: Self-pay | Admitting: Internal Medicine

## 2020-05-21 ENCOUNTER — Other Ambulatory Visit: Payer: Self-pay | Admitting: Internal Medicine

## 2020-05-23 ENCOUNTER — Other Ambulatory Visit: Payer: Self-pay

## 2020-05-23 ENCOUNTER — Ambulatory Visit (INDEPENDENT_AMBULATORY_CARE_PROVIDER_SITE_OTHER): Payer: 59

## 2020-05-23 ENCOUNTER — Telehealth: Payer: Self-pay

## 2020-05-23 VITALS — BP 122/86 | HR 80 | Temp 98.9°F | Ht 66.25 in | Wt 216.8 lb

## 2020-05-23 DIAGNOSIS — E538 Deficiency of other specified B group vitamins: Secondary | ICD-10-CM

## 2020-05-23 MED ORDER — CYANOCOBALAMIN 1000 MCG/ML IJ SOLN
1000.0000 ug | Freq: Once | INTRAMUSCULAR | Status: AC
  Administered 2020-05-23: 1000 ug via INTRAMUSCULAR

## 2020-05-23 NOTE — Telephone Encounter (Signed)
-----   Message from Dorothyann Peng, MD sent at 05/23/2020 10:36 AM EDT ----- Yes, she will need to pay for these.  ----- Message ----- From: Mariam Dollar, CMA Sent: 05/23/2020  10:19 AM EDT To: Dorothyann Peng, MD  The pt would like to know if she can do the Lipo B injections once per month instead of the regular b12 injections monthly.

## 2020-06-12 ENCOUNTER — Other Ambulatory Visit: Payer: Self-pay | Admitting: Internal Medicine

## 2020-06-12 DIAGNOSIS — Z1231 Encounter for screening mammogram for malignant neoplasm of breast: Secondary | ICD-10-CM

## 2020-06-13 ENCOUNTER — Other Ambulatory Visit: Payer: Self-pay

## 2020-06-13 ENCOUNTER — Ambulatory Visit
Admission: RE | Admit: 2020-06-13 | Discharge: 2020-06-13 | Disposition: A | Discharge status: Home / Self Care | Payer: 59 | Source: Ambulatory Visit | Attending: Internal Medicine | Admitting: Internal Medicine

## 2020-06-13 DIAGNOSIS — Z1231 Encounter for screening mammogram for malignant neoplasm of breast: Secondary | ICD-10-CM

## 2020-06-15 ENCOUNTER — Ambulatory Visit: Payer: 59 | Admitting: Internal Medicine

## 2020-06-15 ENCOUNTER — Other Ambulatory Visit: Payer: Self-pay

## 2020-06-15 ENCOUNTER — Encounter: Payer: Self-pay | Admitting: Internal Medicine

## 2020-06-15 VITALS — BP 128/70 | HR 72 | Temp 98.1°F | Ht 65.0 in | Wt 216.2 lb

## 2020-06-15 DIAGNOSIS — E538 Deficiency of other specified B group vitamins: Secondary | ICD-10-CM

## 2020-06-15 DIAGNOSIS — I129 Hypertensive chronic kidney disease with stage 1 through stage 4 chronic kidney disease, or unspecified chronic kidney disease: Secondary | ICD-10-CM | POA: Diagnosis not present

## 2020-06-15 DIAGNOSIS — Z6835 Body mass index (BMI) 35.0-35.9, adult: Secondary | ICD-10-CM

## 2020-06-15 DIAGNOSIS — N182 Chronic kidney disease, stage 2 (mild): Secondary | ICD-10-CM

## 2020-06-15 NOTE — Progress Notes (Signed)
I,Tianna Badgett,acting as a Neurosurgeon for Gwynneth Aliment, MD.,have documented all relevant documentation on the behalf of Gwynneth Aliment, MD,as directed by  Gwynneth Aliment, MD while in the presence of Gwynneth Aliment, MD.  This visit occurred during the SARS-CoV-2 public health emergency.  Safety protocols were in place, including screening questions prior to the visit, additional usage of staff PPE, and extensive cleaning of exam room while observing appropriate contact time as indicated for disinfecting solutions.  Subjective:     Patient ID: Shannon Mendoza , female    DOB: 18-Apr-1956 , 64 y.o.   MRN: [id number]   Chief Complaint  Patient presents with  . Hypertension    HPI  Patients is here today for hypertension follow up. She states that she is compliant with her medication.   Hypertension This is a chronic problem. The current episode started more than 1 year ago. The problem has been gradually improving since onset. The problem is controlled. Pertinent negatives include no blurred vision, chest pain, headaches, neck pain, orthopnea, palpitations or shortness of breath. Risk factors for coronary artery disease include sedentary lifestyle, obesity and post-menopausal state.     Past Medical History:  Diagnosis Date  . HTN (hypertension)   . Obesity      Family History  Problem Relation Age of Onset  . Hypertension Mother   . Diabetes Mother   . Cancer Mother   . Stroke Father   . Cancer Sister      Current Outpatient Medications:  .  amLODipine (NORVASC) 5 MG tablet, TAKE 1 TABLET BY MOUTH EVERY DAY, Disp: 90 tablet, Rfl: 1 .  Cholecalciferol (VITAMIN D-3) 125 MCG (5000 UT) TABS, Take 1 tablet by mouth daily., Disp: , Rfl:  .  Multiple Vitamins-Minerals (CENTRUM SILVER 50+WOMEN PO), Take by mouth., Disp: , Rfl:  .  omeprazole (PRILOSEC) 20 MG capsule, Take 1 capsule (20 mg total) by mouth daily., Disp: 90 capsule, Rfl: 1 .  telmisartan-hydrochlorothiazide (MICARDIS  HCT) 80-25 MG tablet, TAKE 1 TABLET BY MOUTH EVERY DAY, Disp: 90 tablet, Rfl: 2   Allergies  Allergen Reactions  . Codeine Nausea And Vomiting     Review of Systems  Constitutional: Negative.   Eyes: Negative for blurred vision.  Respiratory: Negative.  Negative for shortness of breath.   Cardiovascular: Negative.  Negative for chest pain, palpitations and orthopnea.  Gastrointestinal: Negative.   Musculoskeletal: Negative for neck pain.  Neurological: Negative.  Negative for headaches.     Today's Vitals   06/15/20 1624  BP: 128/70  Pulse: 72  Temp: 98.1 F (36.7 C)  TempSrc: Oral  Weight: 216 lb 3.2 oz (98.1 kg)  Height: 5\' 5"  (1.651 m)   Body mass index is 35.98 kg/m.   Objective:  Physical Exam Vitals and nursing note reviewed.  Constitutional:      Appearance: Normal appearance. She is obese.  HENT:     Head: Normocephalic and atraumatic.  Cardiovascular:     Rate and Rhythm: Normal rate and regular rhythm.     Heart sounds: Normal heart sounds.  Pulmonary:     Effort: Pulmonary effort is normal.     Breath sounds: Normal breath sounds.  Skin:    General: Skin is warm.  Neurological:     General: No focal deficit present.     Mental Status: She is alert.  Psychiatric:        Mood and Affect: Mood normal.  Behavior: Behavior normal.         Assessment And Plan:     1. Hypertensive nephropathy Comments: Chronic, well controlled.  She will continue with current meds. She is encouraged to avoid adding salt to her foods.   2. Chronic renal disease, stage II Comments: Chronic, I will check renal function at her next visit.   3. B12 deficiency Comments: She will be given vitamin B12 IM x1.   4. Class 2 severe obesity due to excess calories with serious comorbidity and body mass index (BMI) of 35.0 to 35.9 in adult [facility]) She is encouraged to strive for BMI less than 30 to decrease cardiac risk. Advised to aim for at least 150 minutes of exercise  per week. Wt Readings from Last 3 Encounters:  06/15/20 216 lb 3.2 oz (98.1 kg)  05/23/20 216 lb 12.8 oz (98.3 kg)  04/20/20 217 lb 9.6 oz (98.7 kg)      Patient was given opportunity to ask questions. Patient verbalized understanding of the plan and was able to repeat key elements of the plan. All questions were answered to their satisfaction.  Gwynneth Aliment, MD   I, Gwynneth Aliment, MD, have reviewed all documentation for this visit. The documentation on 06/18/20 for the exam, diagnosis, procedures, and orders are all accurate and complete.  THE PATIENT IS ENCOURAGED TO PRACTICE SOCIAL DISTANCING DUE TO THE COVID-19 PANDEMIC.

## 2020-06-15 NOTE — Patient Instructions (Signed)

## 2020-08-01 ENCOUNTER — Ambulatory Visit: Payer: 59

## 2020-08-01 ENCOUNTER — Other Ambulatory Visit: Payer: Self-pay

## 2020-09-05 ENCOUNTER — Other Ambulatory Visit: Payer: Self-pay

## 2020-09-05 ENCOUNTER — Encounter: Payer: Self-pay | Admitting: Internal Medicine

## 2020-09-05 ENCOUNTER — Ambulatory Visit: Payer: 59 | Admitting: Internal Medicine

## 2020-09-05 VITALS — BP 144/96 | HR 87 | Ht 65.0 in | Wt 216.2 lb

## 2020-09-05 DIAGNOSIS — I10 Essential (primary) hypertension: Secondary | ICD-10-CM | POA: Diagnosis not present

## 2020-09-05 DIAGNOSIS — E538 Deficiency of other specified B group vitamins: Secondary | ICD-10-CM | POA: Diagnosis not present

## 2020-09-05 DIAGNOSIS — Z6835 Body mass index (BMI) 35.0-35.9, adult: Secondary | ICD-10-CM

## 2020-09-05 DIAGNOSIS — R7309 Other abnormal glucose: Secondary | ICD-10-CM

## 2020-09-05 MED ORDER — CYANOCOBALAMIN 1000 MCG/ML IJ SOLN: 1000.0000 ug | Freq: Once | INTRAMUSCULAR | Status: DC

## 2020-09-05 NOTE — Patient Instructions (Signed)

## 2020-09-05 NOTE — Progress Notes (Signed)
I,Katawbba Wiggins,acting as a Education administrator for Maximino Greenland, MD.,have documented all relevant documentation on the behalf of Maximino Greenland, MD,as directed by  Maximino Greenland, MD while in the presence of Maximino Greenland, MD.  This visit occurred during the SARS-CoV-2 public health emergency.  Safety protocols were in place, including screening questions prior to the visit, additional usage of staff PPE, and extensive cleaning of exam room while observing appropriate contact time as indicated for disinfecting solutions.  Subjective:     Patient ID: Shannon Mendoza , female    DOB: 1956-08-15 , 64 y.o.   MRN: [id number]   Chief Complaint  Patient presents with  . Hypertension    HPI  Patients is here today for hypertension follow up. She states that she is compliant with her medication. She thinks BP is up today because she has only had 2 hours of sleep. She gets off at [facility], she took nap and came in for appt.   Hypertension This is a chronic problem. The current episode started more than 1 year ago. The problem has been gradually improving since onset. The problem is controlled. Pertinent negatives include no blurred vision, chest pain, headaches, neck pain, orthopnea, palpitations or shortness of breath. Risk factors for coronary artery disease include sedentary lifestyle, obesity and post-menopausal state.     Past Medical History:  Diagnosis Date  . HTN (hypertension)   . Obesity      Family History  Problem Relation Age of Onset  . Hypertension Mother   . Diabetes Mother   . Cancer Mother   . Stroke Father   . Cancer Sister      Current Outpatient Medications:  .  amLODipine (NORVASC) 5 MG tablet, TAKE 1 TABLET BY MOUTH EVERY DAY, Disp: 90 tablet, Rfl: 1 .  Multiple Vitamins-Minerals (CENTRUM SILVER 50+WOMEN PO), Take by mouth., Disp: , Rfl:  .  omeprazole (PRILOSEC) 20 MG capsule, Take 1 capsule (20 mg total) by mouth daily., Disp: 90 capsule, Rfl: 1 .   telmisartan-hydrochlorothiazide (MICARDIS HCT) 80-25 MG tablet, TAKE 1 TABLET BY MOUTH EVERY DAY, Disp: 90 tablet, Rfl: 2 .  Cholecalciferol (VITAMIN D-3) 125 MCG (5000 UT) TABS, Take 1 tablet by mouth daily. (Patient not taking: Reported on 09/05/2020), Disp: , Rfl:   Current Facility-Administered Medications:  .  cyanocobalamin ((VITAMIN B-12)) injection 1,000 mcg, 1,000 mcg, Intramuscular, Once, [place] Chard, MD   Allergies  Allergen Reactions  . Codeine Nausea And Vomiting     Review of Systems  Constitutional: Negative.   Eyes: Negative for blurred vision.  Respiratory: Negative.  Negative for shortness of breath.   Cardiovascular: Negative.  Negative for chest pain, palpitations and orthopnea.  Gastrointestinal: Negative.   Musculoskeletal: Negative for neck pain.  Neurological: Negative.  Negative for headaches.  Psychiatric/Behavioral: Negative.      Today's Vitals   09/05/20 0911  BP: (!) 144/96  Pulse: 87  Weight: 216 lb 3.2 oz (98.1 kg)  Height: $Remove'5\' 5"'PaqxJgX$  (1.651 m)  PainSc: 2   PainLoc: Head   Body mass index is 35.98 kg/m.  Wt Readings from Last 3 Encounters:  09/05/20 216 lb 3.2 oz (98.1 kg)  06/15/20 216 lb 3.2 oz (98.1 kg)  05/23/20 216 lb 12.8 oz (98.3 kg)   BP Readings from Last 3 Encounters:  09/05/20 (!) 144/96  06/15/20 128/70  05/23/20 122/86   Objective:  Physical Exam Vitals and nursing note reviewed.  Constitutional:      Appearance: Normal appearance.  She is obese.  HENT:     Head: Normocephalic and atraumatic.  Cardiovascular:     Rate and Rhythm: Normal rate and regular rhythm.     Heart sounds: Normal heart sounds.  Pulmonary:     Effort: Pulmonary effort is normal.     Breath sounds: Normal breath sounds.  Skin:    General: Skin is warm.  Neurological:     General: No focal deficit present.     Mental Status: She is alert.  Psychiatric:        Mood and Affect: Mood normal.        Behavior: Behavior normal.          Assessment And Plan:     1. Essential hypertension, benign Comments: Chronic, fair control. She will continue with current meds for now. Encouraged to avoid adding salt to his foods. She will rto in six months for re-evaluation.  - TCN6+FREV - BASIC METABOLIC PANEL WITH GFR  2. Other abnormal glucose Comments: Her A1c has been elevated in the past, I will recheck this today. She is encouraged to avoid sugary beverages, including diet.  - Hemoglobin A1c - Insulin, random(561)  3. B12 deficiency Comments: I will check levels today. She elected to have Lipo-B12 injection today. She will continue with monthly injections for now.  - cyanocobalamin ((VITAMIN B-12)) injection 1,000 mcg - Vitamin B12  4. Class 2 severe obesity due to excess calories with serious comorbidity and body mass index (BMI) of 35.0 to 35.9 in adult [facility]) Comments: She is encouraged to strive for BMI less than 30 to decrease cardiac risk. Advised to aim for at least 150 minutes of exercise per week.     Patient was given opportunity to ask questions. Patient verbalized understanding of the plan and was able to repeat key elements of the plan. All questions were answered to their satisfaction.  Maximino Greenland, MD   I, Maximino Greenland, MD, have reviewed all documentation for this visit. The documentation on 09/05/20 for the exam, diagnosis, procedures, and orders are all accurate and complete.  THE PATIENT IS ENCOURAGED TO PRACTICE SOCIAL DISTANCING DUE TO THE COVID-19 PANDEMIC.

## 2020-09-06 LAB — VITAMIN B12: Vitamin B-12: 373 pg/mL (ref 200–1100)

## 2020-09-06 LAB — HEMOGLOBIN A1C
Hgb A1c MFr Bld: 6.5 % of total Hgb — ABNORMAL HIGH (ref <5.7)
Mean Plasma Glucose: 140 (calc)
eAG (mmol/L): 7.7 (calc)

## 2020-09-06 LAB — BASIC METABOLIC PANEL WITH GFR
BUN: 15 mg/dL (ref 7–25)
CO2: 30 mmol/L (ref 20–32)
Calcium: 10.4 mg/dL (ref 8.6–10.4)
Chloride: 102 mmol/L (ref 98–110)
Creat: 0.83 mg/dL (ref 0.50–0.99)
GFR, Est African American: 87 mL/min/{1.73_m2} (ref >=60)
GFR, Est Non African American: 75 mL/min/{1.73_m2} (ref >=60)
Glucose, Bld: 101 mg/dL — ABNORMAL HIGH (ref 65–99)
Potassium: 3.5 mmol/L (ref 3.5–5.3)
Sodium: 139 mmol/L (ref 135–146)

## 2020-09-06 LAB — INSULIN, RANDOM: Insulin: 25.4 u[IU]/mL — ABNORMAL HIGH

## 2020-09-19 ENCOUNTER — Other Ambulatory Visit: Payer: Self-pay

## 2020-09-19 ENCOUNTER — Telehealth: Payer: Self-pay

## 2020-09-19 MED ORDER — OZEMPIC (0.25 OR 0.5 MG/DOSE) 2 MG/1.5ML ~~LOC~~ SOPN: 0.5000 mg | PEN_INJECTOR | SUBCUTANEOUS | 3 refills | Status: DC

## 2020-09-19 NOTE — Telephone Encounter (Signed)
The pt was in today for a Ozempic training.  The pt said that she said last week that she would take a pill for diabetes and not a shot.  The pt was told that this appt was scheduled before last week.Shannon Mendoza

## 2020-09-19 NOTE — Progress Notes (Signed)
This encounter was created in error - please disregard.

## 2020-09-20 ENCOUNTER — Telehealth: Payer: Self-pay

## 2020-09-20 NOTE — Telephone Encounter (Signed)
The pt was notified that DR Allyne Gee said that the pt can take rybelsus 3 mg 1 tab daily.  The pt was notified of the information in the packet of samples.

## 2020-09-20 NOTE — Telephone Encounter (Signed)
She can start Rybelsus. Is she still here so you can advise her how to do that? In future, ask me while pt is in the building. thanks

## 2020-10-08 IMAGING — DX DG CHEST 2V
2 series · 2 of 2 positions shown · non-contrast
Comparison: Radiograph 08/12/2013

CLINICAL DATA: Chest pain

EXAM:
CHEST - 2 VIEW

[chest pa]
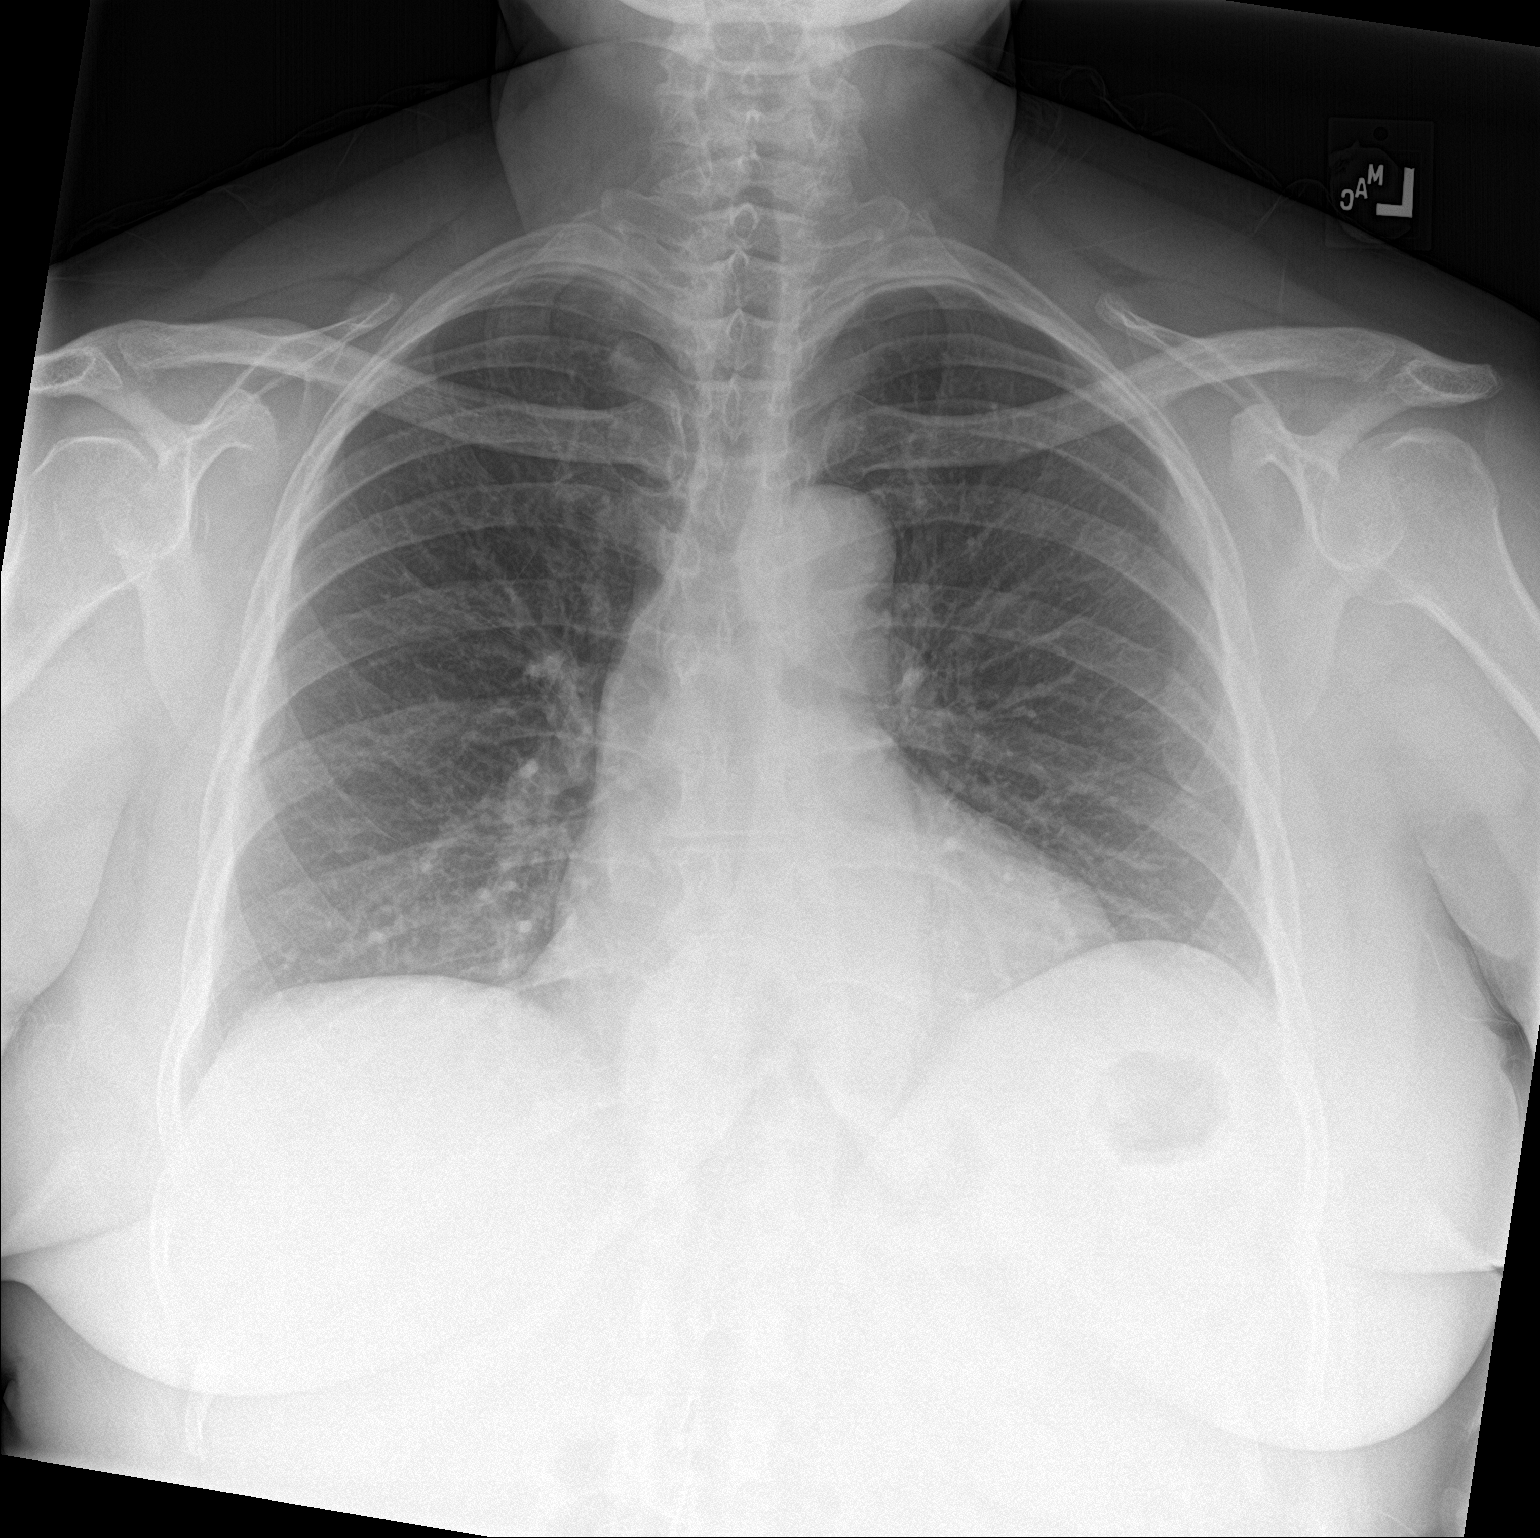

[chest lat]
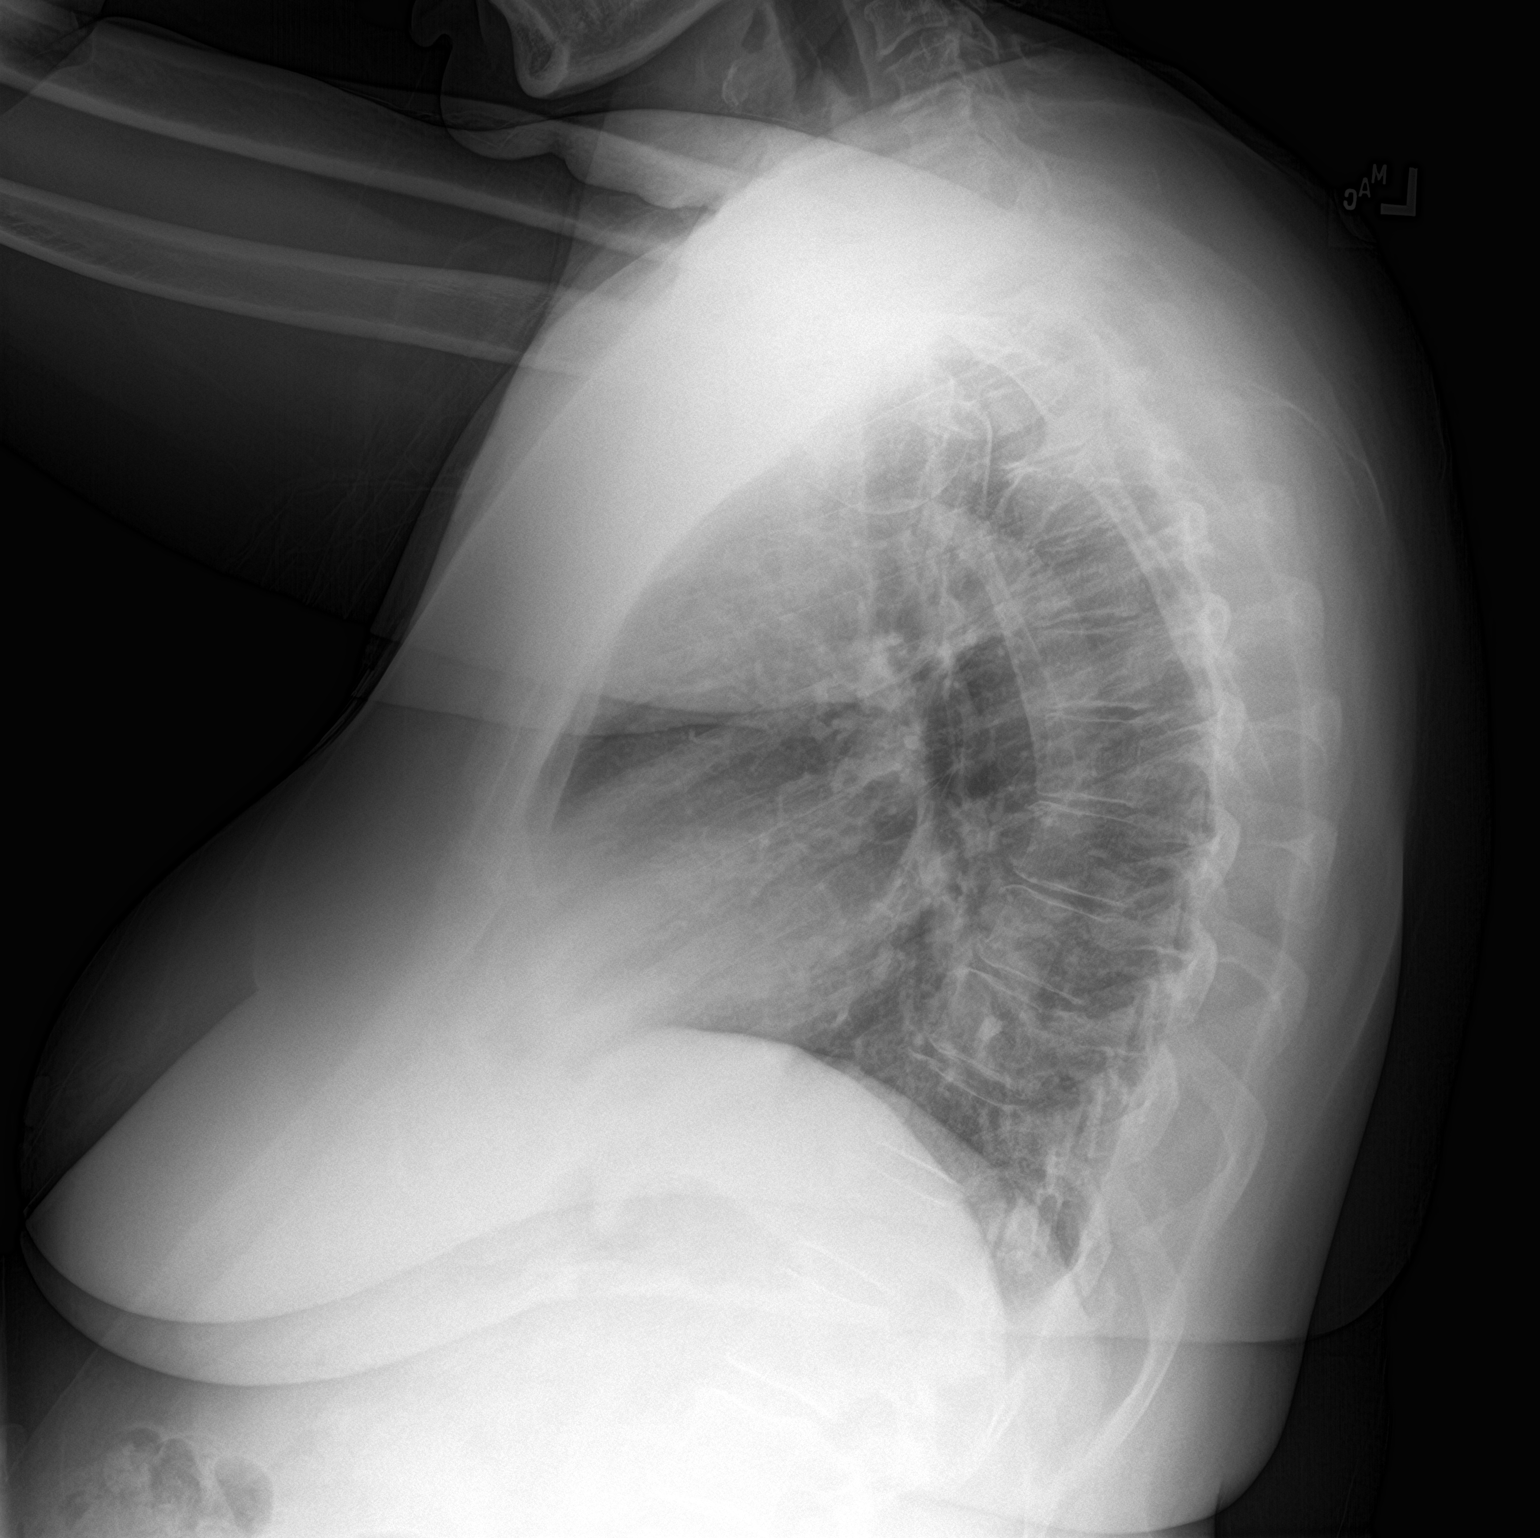

[2 of 2 positions shown; findings below may reference images not displayed]

FINDINGS: Low lung volumes and atelectasis. Mild central vascular crowding. No
consolidation, features of edema, pneumothorax, or effusion.
Pulmonary vascularity is normally distributed. The cardiomediastinal
contours are unremarkable. No acute osseous or soft tissue
abnormality.
IMPRESSION: Mild basilar atelectasis. No other acute cardiopulmonary
abnormality.

## 2020-10-26 ENCOUNTER — Ambulatory Visit: Payer: Self-pay | Admitting: Internal Medicine

## 2020-11-07 ENCOUNTER — Ambulatory Visit: Payer: Self-pay | Admitting: Internal Medicine

## 2020-11-21 ENCOUNTER — Other Ambulatory Visit: Payer: Self-pay | Admitting: Internal Medicine

## 2021-01-02 ENCOUNTER — Ambulatory Visit (INDEPENDENT_AMBULATORY_CARE_PROVIDER_SITE_OTHER): Payer: 59

## 2021-01-02 ENCOUNTER — Other Ambulatory Visit: Payer: Self-pay

## 2021-01-02 VITALS — BP 138/92 | HR 84 | Temp 98.2°F | Ht 65.0 in | Wt 209.6 lb

## 2021-01-02 DIAGNOSIS — E538 Deficiency of other specified B group vitamins: Secondary | ICD-10-CM | POA: Diagnosis not present

## 2021-01-02 MED ORDER — CYANOCOBALAMIN 1000 MCG/ML IJ SOLN
1000.0000 ug | Freq: Once | INTRAMUSCULAR | Status: DC
Start: 2021-01-02 — End: 2021-01-02
  Administered 2021-01-02: 1000 ug via INTRAMUSCULAR

## 2021-01-16 ENCOUNTER — Ambulatory Visit: Payer: 59

## 2021-01-16 ENCOUNTER — Other Ambulatory Visit: Payer: Self-pay

## 2021-01-16 VITALS — BP 132/82 | HR 95 | Temp 98.1°F | Ht 65.0 in | Wt 208.2 lb

## 2021-01-16 DIAGNOSIS — E538 Deficiency of other specified B group vitamins: Secondary | ICD-10-CM | POA: Diagnosis not present

## 2021-01-16 MED ORDER — CYANOCOBALAMIN 1000 MCG/ML IJ SOLN
1000.0000 ug | Freq: Once | INTRAMUSCULAR | Status: AC
  Administered 2021-01-16: 1000 ug via INTRAMUSCULAR

## 2021-01-16 NOTE — Progress Notes (Signed)
Pt is here for a b-12 injection.

## 2021-01-29 ENCOUNTER — Other Ambulatory Visit: Payer: Self-pay | Admitting: Internal Medicine

## 2021-01-29 DIAGNOSIS — Z1231 Encounter for screening mammogram for malignant neoplasm of breast: Secondary | ICD-10-CM

## 2021-02-08 ENCOUNTER — Other Ambulatory Visit: Payer: Self-pay

## 2021-02-08 ENCOUNTER — Encounter: Payer: Self-pay | Admitting: Internal Medicine

## 2021-02-08 ENCOUNTER — Ambulatory Visit: Payer: 59 | Admitting: Internal Medicine

## 2021-02-08 VITALS — BP 132/94 | HR 74 | Temp 98.2°F | Ht 65.0 in | Wt 212.4 lb

## 2021-02-08 DIAGNOSIS — E538 Deficiency of other specified B group vitamins: Secondary | ICD-10-CM

## 2021-02-08 DIAGNOSIS — R7309 Other abnormal glucose: Secondary | ICD-10-CM

## 2021-02-08 DIAGNOSIS — I10 Essential (primary) hypertension: Secondary | ICD-10-CM

## 2021-02-08 DIAGNOSIS — Z6835 Body mass index (BMI) 35.0-35.9, adult: Secondary | ICD-10-CM

## 2021-02-08 DIAGNOSIS — R0789 Other chest pain: Secondary | ICD-10-CM

## 2021-02-08 MED ORDER — CYANOCOBALAMIN 1000 MCG/ML IJ SOLN
1000.0000 ug | Freq: Once | INTRAMUSCULAR | Status: AC
Start: 2021-02-08 — End: 2021-02-08
  Administered 2021-02-08: 1000 ug via INTRAMUSCULAR

## 2021-02-08 NOTE — Progress Notes (Signed)
I,Katawbba Wiggins,acting as a Education administrator for Maximino Greenland, MD.,have documented all relevant documentation on the behalf of Maximino Greenland, MD,as directed by  Maximino Greenland, MD while in the presence of Maximino Greenland, MD.  This visit occurred during the SARS-CoV-2 public health emergency.  Safety protocols were in place, including screening questions prior to the visit, additional usage of staff PPE, and extensive cleaning of exam room while observing appropriate contact time as indicated for disinfecting solutions.  Subjective:     Patient ID: Shannon Mendoza , female    DOB: [birth year] , 65 y.o.   MRN: [id number]   Chief Complaint  Patient presents with  . Prediabetes  . Hypertension    HPI  The patient is here today for a follow-up on Rybelsus.  This was started to address her glucose intolerance. The patient states she had to stop the medication because it kept her nauseated.   Hypertension This is a chronic problem. The current episode started more than 1 year ago. The problem has been gradually improving since onset. The problem is uncontrolled. Pertinent negatives include no blurred vision, chest pain, palpitations or shortness of breath. Risk factors for coronary artery disease include sedentary lifestyle and post-menopausal state. Past treatments include angiotensin blockers, diuretics and calcium channel blockers. The current treatment provides moderate improvement.     Past Medical History:  Diagnosis Date  . HTN (hypertension)   . Obesity      Family History  Problem Relation Age of Onset  . Hypertension Mother   . Diabetes Mother   . Cancer Mother   . Stroke Father   . Cancer Sister      Current Outpatient Medications:  .  amLODipine (NORVASC) 5 MG tablet, TAKE 1 TABLET BY MOUTH EVERY DAY, Disp: 90 tablet, Rfl: 1 .  Cholecalciferol (VITAMIN D-3) 125 MCG (5000 UT) TABS, Take 1 tablet by mouth daily., Disp: , Rfl:  .  Multiple Vitamins-Minerals (CENTRUM SILVER  50+WOMEN PO), Take by mouth., Disp: , Rfl:  .  telmisartan-hydrochlorothiazide (MICARDIS HCT) 80-25 MG tablet, TAKE 1 TABLET BY MOUTH EVERY DAY, Disp: 90 tablet, Rfl: 2   Allergies  Allergen Reactions  . Codeine Nausea And Vomiting     Review of Systems  Constitutional: Negative.   Eyes: Negative for blurred vision.  Respiratory: Negative.  Negative for shortness of breath.   Cardiovascular: Negative.  Negative for chest pain and palpitations.  Gastrointestinal: Negative.   Neurological: Negative.   Psychiatric/Behavioral: Negative.      Today's Vitals   02/08/21 1536  BP: (!) 132/94  Pulse: 74  Temp: 98.2 F (36.8 C)  TempSrc: Oral  Weight: 212 lb 6.4 oz (96.3 kg)  Height: $Remove'5\' 5"'LCPEnRC$  (1.651 m)   Body mass index is 35.35 kg/m.  Wt Readings from Last 3 Encounters:  02/13/21 209 lb 3.2 oz (94.9 kg)  02/08/21 212 lb 6.4 oz (96.3 kg)  01/16/21 208 lb 3.2 oz (94.4 kg)   BP Readings from Last 3 Encounters:  02/13/21 128/78  02/08/21 (!) 132/94  01/16/21 132/82     Objective:  Physical Exam Vitals and nursing note reviewed.  Constitutional:      Appearance: Normal appearance.  HENT:     Head: Normocephalic and atraumatic.     Nose:     Comments: Masked     Mouth/Throat:     Comments: Masked  Cardiovascular:     Rate and Rhythm: Normal rate and regular rhythm.     Heart  sounds: Normal heart sounds.  Pulmonary:     Effort: Pulmonary effort is normal.     Breath sounds: Normal breath sounds.  Skin:    General: Skin is warm.  Neurological:     General: No focal deficit present.     Mental Status: She is alert.  Psychiatric:        Mood and Affect: Mood normal.        Behavior: Behavior normal.         Assessment And Plan:     1. Other abnormal glucose Comments: Intolerant of Rybelsus.  I will recheck an a1c today. She is encouraged to limit her intake of sweetened beverages, including diet drinks.  - CMP14+EGFR - Hemoglobin A1c  2. Essential hypertension,  benign Comments: Chronic, fair control. She is encouraged to follow low sodium diet.  - CMP14+EGFR - COMPLETE METABOLIC PANEL WITH GFR  3. B12 deficiency Comments: I will check vitamin B12 level today. She will also be given vitamin B12 IM  x1 today.  - cyanocobalamin ((VITAMIN B-12)) injection 1,000 mcg - Vitamin B12  4. Class 2 severe obesity due to excess calories with serious comorbidity and body mass index (BMI) of 35.0 to 35.9 in adult [facility])  She is encouraged to strive for BMI less than 30 to decrease cardiac risk. Advised to aim for at least 150 minutes of exercise per week.   Patient was given opportunity to ask questions. Patient verbalized understanding of the plan and was able to repeat key elements of the plan. All questions were answered to their satisfaction.   I, Maximino Greenland, MD, have reviewed all documentation for this visit. The documentation on 02/08/21 for the exam, diagnosis, procedures, and orders are all accurate and complete.   IF YOU HAVE BEEN REFERRED TO A SPECIALIST, IT MAY TAKE 1-2 WEEKS TO SCHEDULE/PROCESS THE REFERRAL. IF YOU HAVE NOT HEARD FROM US/SPECIALIST IN TWO WEEKS, PLEASE GIVE [place] A CALL AT [phone].   THE PATIENT IS ENCOURAGED TO PRACTICE SOCIAL DISTANCING DUE TO THE COVID-19 PANDEMIC.

## 2021-02-08 NOTE — Patient Instructions (Addendum)

## 2021-02-09 LAB — HEMOGLOBIN A1C
Hgb A1c MFr Bld: 6.4 % of total Hgb — ABNORMAL HIGH (ref <5.7)
Mean Plasma Glucose: 137 mg/dL
eAG (mmol/L): 7.6 mmol/L

## 2021-02-09 LAB — COMPLETE METABOLIC PANEL WITH GFR
AG Ratio: 1.5 (calc) (ref 1.0–2.5)
ALT: 26 U/L (ref 6–29)
AST: 22 U/L (ref 10–35)
Albumin: 4.3 g/dL (ref 3.6–5.1)
Alkaline phosphatase (APISO): 80 U/L (ref 37–153)
BUN: 13 mg/dL (ref 7–25)
CO2: 30 mmol/L (ref 20–32)
Calcium: 10.5 mg/dL — ABNORMAL HIGH (ref 8.6–10.4)
Chloride: 102 mmol/L (ref 98–110)
Creat: 0.82 mg/dL (ref 0.50–0.99)
GFR, Est African American: 88 mL/min/{1.73_m2} (ref >=60)
GFR, Est Non African American: 76 mL/min/{1.73_m2} (ref >=60)
Globulin: 2.8 g/dL (calc) (ref 1.9–3.7)
Glucose, Bld: 101 mg/dL — ABNORMAL HIGH (ref 65–99)
Potassium: 4.3 mmol/L (ref 3.5–5.3)
Sodium: 142 mmol/L (ref 135–146)
Total Bilirubin: 0.6 mg/dL (ref 0.2–1.2)
Total Protein: 7.1 g/dL (ref 6.1–8.1)

## 2021-02-09 LAB — VITAMIN B12: Vitamin B-12: 435 pg/mL (ref 200–1100)

## 2021-02-10 LAB — CMP14+EGFR
ALT: 29 IU/L (ref 0–32)
AST: 22 IU/L (ref 0–40)
Albumin/Globulin Ratio: 1.7 (ref 1.2–2.2)
Albumin: 4.5 g/dL (ref 3.8–4.8)
Alkaline Phosphatase: 85 IU/L (ref 44–121)
BUN/Creatinine Ratio: 14 (ref 12–28)
BUN: 12 mg/dL (ref 8–27)
Bilirubin Total: 0.4 mg/dL (ref 0.0–1.2)
CO2: 25 mmol/L (ref 20–29)
Calcium: 10.3 mg/dL (ref 8.7–10.3)
Chloride: 99 mmol/L (ref 96–106)
Creatinine, Ser: 0.87 mg/dL (ref 0.57–1.00)
Globulin, Total: 2.7 g/dL (ref 1.5–4.5)
Glucose: 102 mg/dL — ABNORMAL HIGH (ref 65–99)
Potassium: 4.3 mmol/L (ref 3.5–5.2)
Sodium: 142 mmol/L (ref 134–144)
Total Protein: 7.2 g/dL (ref 6.0–8.5)
eGFR: 74 mL/min/{1.73_m2} (ref >59)

## 2021-02-13 ENCOUNTER — Ambulatory Visit (INDEPENDENT_AMBULATORY_CARE_PROVIDER_SITE_OTHER): Payer: 59

## 2021-02-13 ENCOUNTER — Other Ambulatory Visit: Payer: Self-pay

## 2021-02-13 VITALS — BP 128/78 | HR 91 | Temp 98.1°F | Ht 65.0 in | Wt 209.2 lb

## 2021-02-13 DIAGNOSIS — E538 Deficiency of other specified B group vitamins: Secondary | ICD-10-CM | POA: Diagnosis not present

## 2021-02-13 MED ORDER — CYANOCOBALAMIN 1000 MCG/ML IJ SOLN
1000.0000 ug | Freq: Once | INTRAMUSCULAR | Status: AC
  Administered 2021-02-13: 1000 ug via INTRAMUSCULAR

## 2021-02-13 NOTE — Progress Notes (Signed)
Pt is here for b12 injection. 

## 2021-02-19 ENCOUNTER — Other Ambulatory Visit: Payer: Self-pay | Admitting: Internal Medicine

## 2021-03-13 ENCOUNTER — Other Ambulatory Visit: Payer: Self-pay

## 2021-03-13 ENCOUNTER — Encounter: Payer: Self-pay | Admitting: Internal Medicine

## 2021-03-13 ENCOUNTER — Ambulatory Visit (INDEPENDENT_AMBULATORY_CARE_PROVIDER_SITE_OTHER): Payer: 59 | Admitting: Internal Medicine

## 2021-03-13 VITALS — BP 138/90 | HR 76 | Temp 98.1°F | Ht 65.0 in | Wt 212.2 lb

## 2021-03-13 DIAGNOSIS — I10 Essential (primary) hypertension: Secondary | ICD-10-CM

## 2021-03-13 DIAGNOSIS — I491 Atrial premature depolarization: Secondary | ICD-10-CM

## 2021-03-13 DIAGNOSIS — Z23 Encounter for immunization: Secondary | ICD-10-CM | POA: Diagnosis not present

## 2021-03-13 DIAGNOSIS — E538 Deficiency of other specified B group vitamins: Secondary | ICD-10-CM

## 2021-03-13 DIAGNOSIS — Z0000 Encounter for general adult medical examination without abnormal findings: Secondary | ICD-10-CM | POA: Diagnosis not present

## 2021-03-13 LAB — POCT URINALYSIS DIPSTICK
Bilirubin, UA: NEGATIVE
Blood, UA: NEGATIVE
Glucose, UA: NEGATIVE
Ketones, UA: NEGATIVE
Nitrite, UA: NEGATIVE
Protein, UA: NEGATIVE
Spec Grav, UA: 1.02 (ref 1.010–1.025)
Urobilinogen, UA: 1 E.U./dL
pH, UA: 7 (ref 5.0–8.0)

## 2021-03-13 LAB — POCT UA - MICROALBUMIN
Albumin/Creatinine Ratio, Urine, POC: <30
Creatinine, POC: 100 mg/dL
Microalbumin Ur, POC: 10 mg/L

## 2021-03-13 MED ORDER — CYANOCOBALAMIN 1000 MCG/ML IJ SOLN
1000.0000 ug | Freq: Once | INTRAMUSCULAR | Status: AC
  Administered 2021-03-13: 1000 ug via INTRAMUSCULAR

## 2021-03-13 MED ORDER — SHINGRIX 50 MCG/0.5ML IM SUSR: 0.5000 mL | Freq: Once | INTRAMUSCULAR | 0 refills | Status: AC

## 2021-03-13 NOTE — Progress Notes (Signed)
I,Katawbba Wiggins,acting as a Neurosurgeon for Gwynneth Aliment, MD.,have documented all relevant documentation on the behalf of Gwynneth Aliment, MD,as directed by  Gwynneth Aliment, MD while in the presence of Gwynneth Aliment, MD.  This visit occurred during the SARS-CoV-2 public health emergency.  Safety protocols were in place, including screening questions prior to the visit, additional usage of staff PPE, and extensive cleaning of exam room while observing appropriate contact time as indicated for disinfecting solutions.  Subjective:     Patient ID: Shannon Mendoza , female    DOB: 12-12-1955 , 65 y.o.   MRN: [id number]   Chief Complaint  Patient presents with  . Annual Exam  . Hypertension    HPI  She is here today for a full physical exam. She wants referral to GYN. aceShe did not go to referral that was placed last year. She does not wish to get pap smear performed today. Prefers to see GYN.   Hypertension This is a chronic problem. The current episode started more than 1 year ago. The problem has been gradually improving since onset. The problem is controlled. Pertinent negatives include no blurred vision, chest pain, headaches, neck pain, orthopnea, palpitations or shortness of breath. Risk factors for coronary artery disease include sedentary lifestyle, obesity and post-menopausal state.     Past Medical History:  Diagnosis Date  . HTN (hypertension)   . Obesity      Family History  Problem Relation Age of Onset  . Hypertension Mother   . Diabetes Mother   . Cancer Mother   . Stroke Father   . Cancer Sister      Current Outpatient Medications:  .  Zoster Vaccine Adjuvanted [facility]) injection, Inject 0.5 mLs into the muscle once for 1 dose., Disp: 0.5 mL, Rfl: 0 .  amLODipine (NORVASC) 5 MG tablet, TAKE 1 TABLET BY MOUTH EVERY DAY, Disp: 90 tablet, Rfl: 1 .  Cholecalciferol (VITAMIN D-3) 125 MCG (5000 UT) TABS, Take 1 tablet by mouth daily., Disp: , Rfl:  .  Multiple  Vitamins-Minerals (CENTRUM SILVER 50+WOMEN PO), Take by mouth., Disp: , Rfl:  .  telmisartan-hydrochlorothiazide (MICARDIS HCT) 80-25 MG tablet, TAKE 1 TABLET BY MOUTH EVERY DAY, Disp: 90 tablet, Rfl: 2   Allergies  Allergen Reactions  . Codeine Nausea And Vomiting      The patient states she uses post menopausal status for birth control. Last LMP was No LMP recorded. Patient is postmenopausal.. Negative for Dysmenorrhea. Negative for: breast discharge, breast lump(s), breast pain and breast self exam. Associated symptoms include abnormal vaginal bleeding. Pertinent negatives include abnormal bleeding (hematology), anxiety, decreased libido, depression, difficulty falling sleep, dyspareunia, history of infertility, nocturia, sexual dysfunction, sleep disturbances, urinary incontinence, urinary urgency, vaginal discharge and vaginal itching. Diet regular.The patient states her exercise level is  intermittent.  . The patient's tobacco use is:  Social History   Tobacco Use  Smoking Status Never Smoker  Smokeless Tobacco Never Used  . She has been exposed to passive smoke. The patient's alcohol use is:  Social History   Substance and Sexual Activity  Alcohol Use Not Currently   Review of Systems  Constitutional: Negative.   HENT: Negative.   Eyes: Negative.  Negative for blurred vision.  Respiratory: Negative.  Negative for shortness of breath.   Cardiovascular: Negative.  Negative for chest pain, palpitations and orthopnea.  Endocrine: Negative.   Genitourinary: Negative.   Musculoskeletal: Negative.  Negative for neck pain.  Skin: Negative.  Allergic/Immunologic: Negative.   Neurological: Negative.  Negative for headaches.  Hematological: Negative.   Psychiatric/Behavioral: Negative.      Today's Vitals   03/13/21 0838  BP: 138/90  Pulse: 76  Temp: 98.1 F (36.7 C)  TempSrc: Oral  Weight: 212 lb 3.2 oz (96.3 kg)  Height: 5\' 5"  (1.651 m)   Body mass index is 35.31 kg/m.    Objective:  Physical Exam Vitals and nursing note reviewed.  Constitutional:      Appearance: Normal appearance.  HENT:     Head: Normocephalic and atraumatic.     Right Ear: Tympanic membrane, ear canal and external ear normal.     Left Ear: Tympanic membrane, ear canal and external ear normal.     Nose:     Comments: Masked     Mouth/Throat:     Comments: Masked  Eyes:     Extraocular Movements: Extraocular movements intact.     Conjunctiva/sclera: Conjunctivae normal.     Pupils: Pupils are equal, round, and reactive to light.  Cardiovascular:     Rate and Rhythm: Normal rate and regular rhythm.     Pulses: Normal pulses.     Heart sounds: Normal heart sounds.  Pulmonary:     Effort: Pulmonary effort is normal.     Breath sounds: Normal breath sounds.  Chest:  Breasts:     Tanner Score is 5.     Right: Normal.     Left: Normal.    Abdominal:     General: Bowel sounds are normal.     Palpations: Abdomen is soft.     Comments: Rounded, soft, difficult to assess organomegaly  Genitourinary:    Comments: deferred Musculoskeletal:        General: Normal range of motion.     Cervical back: Normal range of motion and neck supple.  Skin:    General: Skin is warm and dry.     Comments: Tattoo left shoulder  Neurological:     General: No focal deficit present.     Mental Status: She is alert and oriented to person, place, and time.  Psychiatric:        Mood and Affect: Mood normal.        Behavior: Behavior normal.         Assessment And Plan:     1. Routine general medical examination at health care facility Comments: A full exam was performed. Importance of monthly self breast exams was discussed with the patient. PATIENT IS ADVISED TO GET 30-45 MINUTES REGULAR EXERCISE NO LESS THAN FOUR TO FIVE DAYS PER WEEK - BOTH WEIGHTBEARING EXERCISES AND AEROBIC ARE RECOMMENDED.  PATIENT IS ADVISED TO FOLLOW A HEALTHY DIET WITH AT LEAST SIX FRUITS/VEGGIES PER DAY, DECREASE  INTAKE OF RED MEAT, AND TO INCREASE FISH INTAKE TO TWO DAYS PER WEEK.  MEATS/FISH SHOULD NOT BE FRIED, BAKED OR BROILED IS PREFERABLE.  IT IS ALSO IMPORTANT TO CUT BACK ON YOUR SUGAR INTAKE. PLEASE AVOID ANYTHING WITH ADDED SUGAR, CORN SYRUP OR OTHER SWEETENERS. IF YOU MUST USE A SWEETENER, YOU CAN TRY STEVIA. IT IS ALSO IMPORTANT TO AVOID ARTIFICIALLY SWEETENERS AND DIET BEVERAGES. LASTLY, I SUGGEST WEARING SPF 50 SUNSCREEN ON EXPOSED PARTS AND ESPECIALLY WHEN IN THE DIRECT SUNLIGHT FOR AN EXTENDED PERIOD OF TIME.  PLEASE AVOID FAST FOOD RESTAURANTS AND INCREASE YOUR WATER INTAKE.  - Ambulatory referral to Gynecology - CBC - Lipid panel  2. Essential hypertension, benign Comments: Chronic, fair control. EKG performed, NSR w/ frequent  PACs. Advised to follow low sodium diet. She agrees to f/u in 3 months.  I will adjust meds as needed. - POCT Urinalysis Dipstick (81002) - POCT UA - Microalbumin - EKG 12-Lead  3. Premature atrial contractions Comments: Seen on EKG. I will check magnesium level. She is encouraged to stay well hydrated.  - Magnesium - TSH  4. B12 deficiency Comments: She will be given vitamin B12 IM x 1.  - cyanocobalamin ((VITAMIN B-12)) injection 1,000 mcg  5. Immunization due Comments: I will send rx Shingrix to her local pharmacy.   Patient was given opportunity to ask questions. Patient verbalized understanding of the plan and was able to repeat key elements of the plan. All questions were answered to their satisfaction.   I, Gwynneth Aliment, MD, have reviewed all documentation for this visit. The documentation on 03/13/21 for the exam, diagnosis, procedures, and orders are all accurate and complete.  THE PATIENT IS ENCOURAGED TO PRACTICE SOCIAL DISTANCING DUE TO THE COVID-19 PANDEMIC.

## 2021-03-13 NOTE — Patient Instructions (Signed)
Health Maintenance, Female Adopting a healthy lifestyle and getting preventive care are important in promoting health and wellness. Ask your health care provider about:  The right schedule for you to have regular tests and exams.  Things you can do on your own to prevent diseases and keep yourself healthy. What should I know about diet, weight, and exercise? Eat a healthy diet  Eat a diet that includes plenty of vegetables, fruits, low-fat dairy products, and lean protein.  Do not eat a lot of foods that are high in solid fats, added sugars, or sodium.   Maintain a healthy weight Body mass index (BMI) is used to identify weight problems. It estimates body fat based on height and weight. Your health care provider can help determine your BMI and help you achieve or maintain a healthy weight. Get regular exercise Get regular exercise. This is one of the most important things you can do for your health. Most adults should:  Exercise for at least 150 minutes each week. The exercise should increase your heart rate and make you sweat (moderate-intensity exercise).  Do strengthening exercises at least twice a week. This is in addition to the moderate-intensity exercise.  Spend less time sitting. Even light physical activity can be beneficial. Watch cholesterol and blood lipids Have your blood tested for lipids and cholesterol at 64 years of age, then have this test every 5 years. Have your cholesterol levels checked more often if:  Your lipid or cholesterol levels are high.  You are older than 64 years of age.  You are at high risk for heart disease. What should I know about cancer screening? Depending on your health history and family history, you may need to have cancer screening at various ages. This may include screening for:  Breast cancer.  Cervical cancer.  Colorectal cancer.  Skin cancer.  Lung cancer. What should I know about heart disease, diabetes, and high blood  pressure? Blood pressure and heart disease  High blood pressure causes heart disease and increases the risk of stroke. This is more likely to develop in people who have high blood pressure readings, are of African descent, or are overweight.  Have your blood pressure checked: ? Every 3-5 years if you are 18-39 years of age. ? Every year if you are 40 years old or older. Diabetes Have regular diabetes screenings. This checks your fasting blood sugar level. Have the screening done:  Once every three years after age 40 if you are at a normal weight and have a low risk for diabetes.  More often and at a younger age if you are overweight or have a high risk for diabetes. What should I know about preventing infection? Hepatitis B If you have a higher risk for hepatitis B, you should be screened for this virus. Talk with your health care provider to find out if you are at risk for hepatitis B infection. Hepatitis C Testing is recommended for:  Everyone born from 1945 through 1965.  Anyone with known risk factors for hepatitis C. Sexually transmitted infections (STIs)  Get screened for STIs, including gonorrhea and chlamydia, if: ? You are sexually active and are younger than 64 years of age. ? You are older than 64 years of age and your health care provider tells you that you are at risk for this type of infection. ? Your sexual activity has changed since you were last screened, and you are at increased risk for chlamydia or gonorrhea. Ask your health care provider   if you are at risk.  Ask your health care provider about whether you are at high risk for HIV. Your health care provider may recommend a prescription medicine to help prevent HIV infection. If you choose to take medicine to prevent HIV, you should first get tested for HIV. You should then be tested every 3 months for as long as you are taking the medicine. Pregnancy  If you are about to stop having your period (premenopausal) and  you may become pregnant, seek counseling before you get pregnant.  Take 400 to 800 micrograms (mcg) of folic acid every day if you become pregnant.  Ask for birth control (contraception) if you want to prevent pregnancy. Osteoporosis and menopause Osteoporosis is a disease in which the bones lose minerals and strength with aging. This can result in bone fractures. If you are 65 years old or older, or if you are at risk for osteoporosis and fractures, ask your health care provider if you should:  Be screened for bone loss.  Take a calcium or vitamin D supplement to lower your risk of fractures.  Be given hormone replacement therapy (HRT) to treat symptoms of menopause. Follow these instructions at home: Lifestyle  Do not use any products that contain nicotine or tobacco, such as cigarettes, e-cigarettes, and chewing tobacco. If you need help quitting, ask your health care provider.  Do not use street drugs.  Do not share needles.  Ask your health care provider for help if you need support or information about quitting drugs. Alcohol use  Do not drink alcohol if: ? Your health care provider tells you not to drink. ? You are pregnant, may be pregnant, or are planning to become pregnant.  If you drink alcohol: ? Limit how much you use to 0-1 drink a day. ? Limit intake if you are breastfeeding.  Be aware of how much alcohol is in your drink. In the [place] one drink equals one 12 oz bottle of beer (355 mL), one 5 oz glass of wine (148 mL), or one 1 oz glass of hard liquor (44 mL). General instructions  Schedule regular health, dental, and eye exams.  Stay current with your vaccines.  Tell your health care provider if: ? You often feel depressed. ? You have ever been abused or do not feel safe at home. Summary  Adopting a healthy lifestyle and getting preventive care are important in promoting health and wellness.  Follow your health care provider's instructions about healthy  diet, exercising, and getting tested or screened for diseases.  Follow your health care provider's instructions on monitoring your cholesterol and blood pressure. This information is not intended to replace advice given to you by your health care provider. Make sure you discuss any questions you have with your health care provider. Document Revised: 09/23/2018 Document Reviewed: 09/23/2018 Elsevier Patient Education  2021 [employer] Inc.  

## 2021-03-14 LAB — LIPID PANEL
Cholesterol: 209 mg/dL — ABNORMAL HIGH (ref <200)
HDL: 54 mg/dL (ref >=50)
LDL Cholesterol (Calc): 132 mg/dL (calc) — ABNORMAL HIGH
Non-HDL Cholesterol (Calc): 155 mg/dL (calc) — ABNORMAL HIGH (ref <130)
Total CHOL/HDL Ratio: 3.9 (calc) (ref <5.0)
Triglycerides: 124 mg/dL (ref <150)

## 2021-03-14 LAB — CBC
HCT: 36.7 % (ref 35.0–45.0)
Hemoglobin: 12.1 g/dL (ref 11.7–15.5)
MCH: 27.9 pg (ref 27.0–33.0)
MCHC: 33 g/dL (ref 32.0–36.0)
MCV: 84.6 fL (ref 80.0–100.0)
MPV: 11.3 fL (ref 7.5–12.5)
Platelets: 269 10*3/uL (ref 140–400)
RBC: 4.34 10*6/uL (ref 3.80–5.10)
RDW: 13.2 % (ref 11.0–15.0)
WBC: 5 10*3/uL (ref 3.8–10.8)

## 2021-03-14 LAB — TSH: TSH: 2.02 mIU/L (ref 0.40–4.50)

## 2021-03-14 LAB — MAGNESIUM: Magnesium: 1.8 mg/dL (ref 1.5–2.5)

## 2021-03-16 ENCOUNTER — Telehealth: Payer: Self-pay

## 2021-03-16 NOTE — Telephone Encounter (Signed)
Left the patient a message to call back for lab results or to log in to her mychart to view.

## 2021-03-16 NOTE — Telephone Encounter (Signed)
-----   Message from Dorothyann Peng, MD sent at 03/15/2021  6:33 PM EDT ----- Your magnesium level is normal. Your thyroid function is normal. Your blood count is normal. Your LDL, bad cholesterol is 132. Ideally, this should be less than 100. Are you willing to take cholesterol meds? There has been no improvement from last year.   RS

## 2021-03-20 ENCOUNTER — Ambulatory Visit: Payer: 59

## 2021-06-13 ENCOUNTER — Ambulatory Visit: Payer: 59 | Admitting: Internal Medicine

## 2021-06-13 ENCOUNTER — Encounter: Payer: Self-pay | Admitting: Internal Medicine

## 2021-06-13 ENCOUNTER — Other Ambulatory Visit: Payer: Self-pay | Admitting: Internal Medicine

## 2021-06-13 ENCOUNTER — Ambulatory Visit
Admission: RE | Admit: 2021-06-13 | Discharge: 2021-06-13 | Disposition: A | Discharge status: Home / Self Care | Payer: 59 | Source: Ambulatory Visit | Attending: Internal Medicine | Admitting: Internal Medicine

## 2021-06-13 ENCOUNTER — Other Ambulatory Visit: Payer: Self-pay

## 2021-06-13 VITALS — BP 116/80 | HR 92 | Temp 98.0°F | Ht 65.0 in | Wt 215.8 lb

## 2021-06-13 DIAGNOSIS — I10 Essential (primary) hypertension: Secondary | ICD-10-CM

## 2021-06-13 DIAGNOSIS — R7309 Other abnormal glucose: Secondary | ICD-10-CM

## 2021-06-13 DIAGNOSIS — Z23 Encounter for immunization: Secondary | ICD-10-CM | POA: Diagnosis not present

## 2021-06-13 DIAGNOSIS — Z6835 Body mass index (BMI) 35.0-35.9, adult: Secondary | ICD-10-CM

## 2021-06-13 DIAGNOSIS — Z1231 Encounter for screening mammogram for malignant neoplasm of breast: Secondary | ICD-10-CM

## 2021-06-13 MED ORDER — ZOSTER VAC RECOMB ADJUVANTED 50 MCG/0.5ML IM SUSR: 0.5000 mL | Freq: Once | INTRAMUSCULAR | 0 refills | Status: AC

## 2021-06-13 NOTE — Patient Instructions (Signed)

## 2021-06-13 NOTE — Progress Notes (Signed)
I,Yamilka Roman [employer] as a Neurosurgeon for Gwynneth Aliment, MD.,have documented all relevant documentation on the behalf of Gwynneth Aliment, MD,as directed by  Gwynneth Aliment, MD while in the presence of Gwynneth Aliment, MD.  This visit occurred during the SARS-CoV-2 public health emergency.  Safety protocols were in place, including screening questions prior to the visit, additional usage of staff PPE, and extensive cleaning of exam room while observing appropriate contact time as indicated for disinfecting solutions.  Subjective:     Patient ID: Shannon Mendoza , female    DOB: 10-16-1955 , 65 y.o.   MRN: [id number]   Chief Complaint  Patient presents with   Hypertension    HPI  Patient presents today for a f/u on her bp.  She reports compliance with meds. She denies headaches, chest pain and shortness of breath.    Hypertension This is a chronic problem. The current episode started more than 1 year ago. The problem has been gradually improving since onset. The problem is uncontrolled. Pertinent negatives include no blurred vision, chest pain, palpitations or shortness of breath. Risk factors for coronary artery disease include sedentary lifestyle and post-menopausal state. Past treatments include angiotensin blockers, diuretics and calcium channel blockers. The current treatment provides moderate improvement.    Past Medical History:  Diagnosis Date   HTN (hypertension)    Obesity      Family History  Problem Relation Age of Onset   Hypertension Mother    Diabetes Mother    Cancer Mother    Stroke Father    Cancer Sister      Current Outpatient Medications:    amLODipine (NORVASC) 5 MG tablet, TAKE 1 TABLET BY MOUTH EVERY DAY, Disp: 90 tablet, Rfl: 1   Cholecalciferol (VITAMIN D-3) 125 MCG (5000 UT) TABS, Take 1 tablet by mouth daily., Disp: , Rfl:    Multiple Vitamins-Minerals (CENTRUM SILVER 50+WOMEN PO), Take by mouth., Disp: , Rfl:     telmisartan-hydrochlorothiazide (MICARDIS HCT) 80-25 MG tablet, TAKE 1 TABLET BY MOUTH EVERY DAY, Disp: 90 tablet, Rfl: 2   Zoster Vaccine Adjuvanted (SHINGRIX) injection, Inject 0.5 mLs into the muscle once for 1 dose., Disp: 0.5 mL, Rfl: 0   Allergies  Allergen Reactions   Codeine Nausea And Vomiting     Review of Systems  Constitutional: Negative.   Eyes:  Negative for blurred vision.  Respiratory: Negative.  Negative for shortness of breath.   Cardiovascular: Negative.  Negative for chest pain and palpitations.  Gastrointestinal: Negative.   Neurological: Negative.   Psychiatric/Behavioral: Negative.      Today's Vitals   06/13/21 1155  BP: 116/80  Pulse: 92  Temp: 98 F (36.7 C)  Weight: 215 lb 12.8 oz (97.9 kg)  Height: 5\' 5"  (1.651 m)  PainSc: 0-No pain   Body mass index is 35.91 kg/m.  Wt Readings from Last 3 Encounters:  06/13/21 215 lb 12.8 oz (97.9 kg)  03/13/21 212 lb 3.2 oz (96.3 kg)  02/13/21 209 lb 3.2 oz (94.9 kg)     Objective:  Physical Exam Vitals and nursing note reviewed.  Constitutional:      Appearance: Normal appearance.  HENT:     Head: Normocephalic and atraumatic.     Nose:     Comments: Masked     Mouth/Throat:     Comments: Masked  Cardiovascular:     Rate and Rhythm: Normal rate and regular rhythm.     Heart sounds: Normal heart sounds.  Pulmonary:  Effort: Pulmonary effort is normal.     Breath sounds: Normal breath sounds.  Musculoskeletal:     Cervical back: Normal range of motion.  Skin:    General: Skin is warm.  Neurological:     General: No focal deficit present.     Mental Status: She is alert.  Psychiatric:        Mood and Affect: Mood normal.        Behavior: Behavior normal.        Assessment And Plan:     1. Essential hypertension, benign Comments: Chronic, well controlled. She is encouraged to follow low sodium diet. I will check renal function. She will f/u in Jan 2023.  - BASIC METABOLIC PANEL WITH  GFR  2. Other abnormal glucose Comments: Her a1c has been elevated in the past. I will recheck this today. Encouraged to limit her intake of sweetened beverages including diet drinks.  - Hemoglobin A1c  3. Class 2 severe obesity due to excess calories with serious comorbidity and body mass index (BMI) of 35.0 to 35.9 in adult [facility]) Comments: She is encouraged to strive to lose ten pounds to decrease cardiac risk.   4. Immunization due Comments: I will send rx Shingrix to her local pharmacy.  - Zoster Vaccine Adjuvanted [facility]) injection; Inject 0.5 mLs into the muscle once for 1 dose.  Dispense: 0.5 mL; Refill: 0    Patient was given opportunity to ask questions. Patient verbalized understanding of the plan and was able to repeat key elements of the plan. All questions were answered to their satisfaction.   I, Gwynneth Aliment, MD, have reviewed all documentation for this visit. The documentation on 06/13/21 for the exam, diagnosis, procedures, and orders are all accurate and complete.   IF YOU HAVE BEEN REFERRED TO A SPECIALIST, IT MAY TAKE 1-2 WEEKS TO SCHEDULE/PROCESS THE REFERRAL. IF YOU HAVE NOT HEARD FROM US/SPECIALIST IN TWO WEEKS, PLEASE GIVE [place] A CALL AT [phone].   THE PATIENT IS ENCOURAGED TO PRACTICE SOCIAL DISTANCING DUE TO THE COVID-19 PANDEMIC.

## 2021-06-14 LAB — BASIC METABOLIC PANEL WITH GFR
BUN: 16 mg/dL (ref 7–25)
CO2: 30 mmol/L (ref 20–32)
Calcium: 10.2 mg/dL (ref 8.6–10.4)
Chloride: 100 mmol/L (ref 98–110)
Creat: 0.89 mg/dL (ref 0.50–1.05)
Glucose, Bld: 102 mg/dL — ABNORMAL HIGH (ref 65–99)
Potassium: 3.4 mmol/L — ABNORMAL LOW (ref 3.5–5.3)
Sodium: 139 mmol/L (ref 135–146)
eGFR: 72 mL/min/{1.73_m2} (ref >=60)

## 2021-06-14 LAB — HEMOGLOBIN A1C
Hgb A1c MFr Bld: 6.3 % of total Hgb — ABNORMAL HIGH (ref <5.7)
Mean Plasma Glucose: 134 mg/dL
eAG (mmol/L): 7.4 mmol/L

## 2021-07-16 ENCOUNTER — Inpatient Hospital Stay: Admission: RE | Admit: 2021-07-16 | Payer: 59 | Source: Ambulatory Visit

## 2021-08-28 ENCOUNTER — Other Ambulatory Visit: Payer: Self-pay

## 2021-08-28 ENCOUNTER — Ambulatory Visit: Payer: 59

## 2021-08-28 VITALS — BP 126/84 | HR 78 | Temp 98.9°F | Ht 65.0 in

## 2021-08-28 DIAGNOSIS — E538 Deficiency of other specified B group vitamins: Secondary | ICD-10-CM

## 2021-08-28 MED ORDER — CYANOCOBALAMIN 1000 MCG/ML IJ SOLN
1000.0000 ug | Freq: Once | INTRAMUSCULAR | Status: AC
  Administered 2021-08-28: 1000 ug via INTRAMUSCULAR

## 2021-08-28 NOTE — Progress Notes (Signed)
The patient is here for a vitamin b12 injection.l

## 2021-09-11 ENCOUNTER — Encounter: Payer: Self-pay | Admitting: Internal Medicine

## 2021-09-11 ENCOUNTER — Ambulatory Visit: Payer: 59 | Admitting: Internal Medicine

## 2021-09-11 ENCOUNTER — Other Ambulatory Visit: Payer: Self-pay

## 2021-09-11 VITALS — BP 124/88 | HR 82 | Temp 98.1°F | Ht 65.0 in | Wt 218.8 lb

## 2021-09-11 DIAGNOSIS — R7309 Other abnormal glucose: Secondary | ICD-10-CM

## 2021-09-11 DIAGNOSIS — I10 Essential (primary) hypertension: Secondary | ICD-10-CM | POA: Diagnosis not present

## 2021-09-11 DIAGNOSIS — E538 Deficiency of other specified B group vitamins: Secondary | ICD-10-CM | POA: Diagnosis not present

## 2021-09-11 DIAGNOSIS — Z6836 Body mass index (BMI) 36.0-36.9, adult: Secondary | ICD-10-CM

## 2021-09-11 MED ORDER — CYANOCOBALAMIN 1000 MCG/ML IJ SOLN
1000.0000 ug | Freq: Once | INTRAMUSCULAR | Status: AC
  Administered 2021-09-11: 1000 ug via INTRAMUSCULAR

## 2021-09-11 NOTE — Patient Instructions (Signed)

## 2021-09-11 NOTE — Progress Notes (Signed)
I,Tianna Badgett,acting as a Neurosurgeon for Gwynneth Aliment, MD.,have documented all relevant documentation on the behalf of Gwynneth Aliment, MD,as directed by  Gwynneth Aliment, MD while in the presence of Gwynneth Aliment, MD.  This visit occurred during the SARS-CoV-2 public health emergency.  Safety protocols were in place, including screening questions prior to the visit, additional usage of staff PPE, and extensive cleaning of exam room while observing appropriate contact time as indicated for disinfecting solutions.  Subjective:     Patient ID: Shannon Mendoza , female    DOB: 1956/01/31 , 65 y.o.   MRN: [id number]   Chief Complaint  Patient presents with   Hypertension    HPI  Patient presents today for a f/u on her bp.  She reports compliance with meds. She denies headaches, chest pain and shortness of breath.    Hypertension This is a chronic problem. The current episode started more than 1 year ago. The problem has been gradually improving since onset. The problem is uncontrolled. Pertinent negatives include no blurred vision, chest pain, palpitations or shortness of breath. Risk factors for coronary artery disease include sedentary lifestyle and post-menopausal state. Past treatments include angiotensin blockers, diuretics and calcium channel blockers. The current treatment provides moderate improvement.    Past Medical History:  Diagnosis Date   HTN (hypertension)    Obesity      Family History  Problem Relation Age of Onset   Hypertension Mother    Diabetes Mother    Cancer Mother    Stroke Father    Cancer Sister      Current Outpatient Medications:    amLODipine (NORVASC) 5 MG tablet, TAKE 1 TABLET BY MOUTH EVERY DAY, Disp: 90 tablet, Rfl: 1   Cholecalciferol (VITAMIN D-3) 125 MCG (5000 UT) TABS, Take 1 tablet by mouth daily., Disp: , Rfl:    Multiple Vitamins-Minerals (CENTRUM SILVER 50+WOMEN PO), Take by mouth., Disp: , Rfl:    telmisartan-hydrochlorothiazide  (MICARDIS HCT) 80-25 MG tablet, TAKE 1 TABLET BY MOUTH EVERY DAY, Disp: 90 tablet, Rfl: 2   Allergies  Allergen Reactions   Codeine Nausea And Vomiting     Review of Systems  Constitutional: Negative.   Eyes:  Negative for blurred vision.  Respiratory: Negative.  Negative for shortness of breath.   Cardiovascular: Negative.  Negative for chest pain and palpitations.  Gastrointestinal: Negative.   Neurological: Negative.     Today's Vitals   09/11/21 1157  BP: 124/88  Pulse: 82  Temp: 98.1 F (36.7 C)  TempSrc: Oral  Weight: 218 lb 12.8 oz (99.2 kg)  Height: 5\' 5"  (1.651 m)   Body mass index is 36.41 kg/m.  Wt Readings from Last 3 Encounters:  09/11/21 218 lb 12.8 oz (99.2 kg)  06/13/21 215 lb 12.8 oz (97.9 kg)  03/13/21 212 lb 3.2 oz (96.3 kg)    Objective:  Physical Exam Vitals and nursing note reviewed.  Constitutional:      Appearance: Normal appearance. She is obese.  HENT:     Head: Normocephalic and atraumatic.     Nose:     Comments: Masked     Mouth/Throat:     Comments: Masked  Eyes:     Extraocular Movements: Extraocular movements intact.  Cardiovascular:     Rate and Rhythm: Normal rate and regular rhythm.     Heart sounds: Normal heart sounds.  Pulmonary:     Effort: Pulmonary effort is normal.     Breath sounds: Normal  breath sounds.  Skin:    General: Skin is warm.  Neurological:     General: No focal deficit present.     Mental Status: She is alert.  Psychiatric:        Mood and Affect: Mood normal.        Behavior: Behavior normal.        Assessment And Plan:     1. Essential hypertension, benign Comments: Chronic, fair control. She is aware of diastolic elevation. Encouraged to follow low sodium diet and incorporate more exercise into her daily routine.  - BASIC METABOLIC PANEL WITH GFR  2. Other abnormal glucose Comments: Her a1c has been elevated in the past. I will recheck this today. Advised to limit her intake of refined  carbs and sugary beverages.  - Hemoglobin A1c  3. B12 deficiency Comments: She will be given vitamin B12 IM  x1.  - cyanocobalamin ((VITAMIN B-12)) injection 1,000 mcg  4. Class 2 severe obesity due to excess calories with serious comorbidity and body mass index (BMI) of 36.0 to 36.9 in adult [facility]) Comments:  She is encouraged to strive for BMI less than 30 to decrease cardiac risk. Advised to aim for at least 150 minutes of exercise per week.  Patient was given opportunity to ask questions. Patient verbalized understanding of the plan and was able to repeat key elements of the plan. All questions were answered to their satisfaction.   I, Gwynneth Aliment, MD, have reviewed all documentation for this visit. The documentation on 09/11/21 for the exam, diagnosis, procedures, and orders are all accurate and complete.   IF YOU HAVE BEEN REFERRED TO A SPECIALIST, IT MAY TAKE 1-2 WEEKS TO SCHEDULE/PROCESS THE REFERRAL. IF YOU HAVE NOT HEARD FROM US/SPECIALIST IN TWO WEEKS, PLEASE GIVE [place] A CALL AT [phone].   THE PATIENT IS ENCOURAGED TO PRACTICE SOCIAL DISTANCING DUE TO THE COVID-19 PANDEMIC.

## 2021-09-12 ENCOUNTER — Other Ambulatory Visit: Payer: Self-pay | Admitting: Nurse Practitioner

## 2021-09-12 LAB — BASIC METABOLIC PANEL WITH GFR
BUN: 10 mg/dL (ref 7–25)
CO2: 30 mmol/L (ref 20–32)
Calcium: 10.3 mg/dL (ref 8.6–10.4)
Chloride: 99 mmol/L (ref 98–110)
Creat: 0.93 mg/dL (ref 0.50–1.05)
Glucose, Bld: 101 mg/dL — ABNORMAL HIGH (ref 65–99)
Potassium: 3.8 mmol/L (ref 3.5–5.3)
Sodium: 140 mmol/L (ref 135–146)
eGFR: 69 mL/min/{1.73_m2} (ref >=60)

## 2021-09-13 LAB — LIPID PANEL
Chol/HDL Ratio: 4.6 ratio — ABNORMAL HIGH (ref 0.0–4.4)
Cholesterol, Total: 235 mg/dL — ABNORMAL HIGH (ref 100–199)
HDL: 51 mg/dL (ref >39)
LDL Chol Calc (NIH): 146 mg/dL — ABNORMAL HIGH (ref 0–99)
Triglycerides: 213 mg/dL — ABNORMAL HIGH (ref 0–149)
VLDL Cholesterol Cal: 38 mg/dL (ref 5–40)

## 2021-11-13 ENCOUNTER — Ambulatory Visit: Payer: 59 | Admitting: Internal Medicine

## 2021-11-27 ENCOUNTER — Ambulatory Visit: Payer: 59

## 2021-11-27 ENCOUNTER — Other Ambulatory Visit: Payer: Self-pay

## 2021-11-27 VITALS — BP 132/74 | HR 80 | Temp 98.7°F | Ht 65.0 in | Wt 221.6 lb

## 2021-11-27 DIAGNOSIS — E538 Deficiency of other specified B group vitamins: Secondary | ICD-10-CM | POA: Diagnosis not present

## 2021-11-27 MED ORDER — CYANOCOBALAMIN 1000 MCG/ML IJ SOLN
1000.0000 ug | Freq: Once | INTRAMUSCULAR | Status: AC
  Administered 2021-11-27: 1000 ug via INTRAMUSCULAR

## 2021-11-27 NOTE — Progress Notes (Signed)
Patient is here for b12 injection.  

## 2021-12-04 ENCOUNTER — Ambulatory Visit: Payer: 59

## 2021-12-04 ENCOUNTER — Other Ambulatory Visit: Payer: Self-pay

## 2021-12-04 VITALS — BP 120/84 | HR 81 | Temp 98.2°F | Ht 65.0 in

## 2021-12-04 DIAGNOSIS — E538 Deficiency of other specified B group vitamins: Secondary | ICD-10-CM

## 2021-12-04 MED ORDER — CYANOCOBALAMIN 1000 MCG/ML IJ SOLN
1000.0000 ug | Freq: Once | INTRAMUSCULAR | Status: AC
  Administered 2021-12-04: 1000 ug via INTRAMUSCULAR

## 2021-12-04 NOTE — Progress Notes (Signed)
The patient is here today for her weekly vitamin b12 injection.

## 2021-12-11 ENCOUNTER — Other Ambulatory Visit: Payer: Self-pay

## 2021-12-11 ENCOUNTER — Ambulatory Visit (INDEPENDENT_AMBULATORY_CARE_PROVIDER_SITE_OTHER): Payer: 59

## 2021-12-11 VITALS — BP 128/70 | Temp 98.4°F | Ht 65.0 in | Wt 219.4 lb

## 2021-12-11 DIAGNOSIS — E538 Deficiency of other specified B group vitamins: Secondary | ICD-10-CM

## 2021-12-11 MED ORDER — CYANOCOBALAMIN 1000 MCG/ML IJ SOLN
1000.0000 ug | Freq: Once | INTRAMUSCULAR | Status: AC
  Administered 2021-12-11: 1000 ug via INTRAMUSCULAR

## 2021-12-11 NOTE — Progress Notes (Signed)
Patient presents today for 3rd b12 injection.  

## 2021-12-18 MED ORDER — CYANOCOBALAMIN 1000 MCG/ML IJ SOLN: 1000.0000 ug | Freq: Once | INTRAMUSCULAR | Status: AC

## 2021-12-18 NOTE — Progress Notes (Unsigned)
Pt presents today for last b12 injection.  ?

## 2021-12-27 ENCOUNTER — Other Ambulatory Visit: Payer: Self-pay | Admitting: Internal Medicine

## 2021-12-28 ENCOUNTER — Other Ambulatory Visit: Payer: Self-pay | Admitting: Internal Medicine

## 2022-01-08 ENCOUNTER — Ambulatory Visit (INDEPENDENT_AMBULATORY_CARE_PROVIDER_SITE_OTHER): Payer: 59

## 2022-01-08 ENCOUNTER — Other Ambulatory Visit: Payer: Self-pay

## 2022-01-08 VITALS — BP 128/70 | HR 70 | Temp 98.1°F | Ht 67.0 in | Wt 219.0 lb

## 2022-01-08 DIAGNOSIS — E538 Deficiency of other specified B group vitamins: Secondary | ICD-10-CM

## 2022-01-08 MED ORDER — CYANOCOBALAMIN 1000 MCG/ML IJ SOLN
1000.0000 ug | Freq: Once | INTRAMUSCULAR | Status: AC
  Administered 2022-01-08: 1000 ug via INTRAMUSCULAR

## 2022-01-08 NOTE — Progress Notes (Signed)
Patient presents for b12 injection

## 2022-03-19 ENCOUNTER — Other Ambulatory Visit: Payer: Self-pay | Admitting: Internal Medicine

## 2022-03-19 ENCOUNTER — Ambulatory Visit (INDEPENDENT_AMBULATORY_CARE_PROVIDER_SITE_OTHER): Payer: 59 | Admitting: Internal Medicine

## 2022-03-19 ENCOUNTER — Encounter: Payer: Self-pay | Admitting: Internal Medicine

## 2022-03-19 VITALS — BP 132/90 | HR 84 | Temp 98.1°F | Ht 67.0 in | Wt 217.8 lb

## 2022-03-19 DIAGNOSIS — Z6834 Body mass index (BMI) 34.0-34.9, adult: Secondary | ICD-10-CM | POA: Diagnosis not present

## 2022-03-19 DIAGNOSIS — I10 Essential (primary) hypertension: Secondary | ICD-10-CM

## 2022-03-19 DIAGNOSIS — Z1211 Encounter for screening for malignant neoplasm of colon: Secondary | ICD-10-CM

## 2022-03-19 DIAGNOSIS — Z1231 Encounter for screening mammogram for malignant neoplasm of breast: Secondary | ICD-10-CM

## 2022-03-19 DIAGNOSIS — Z23 Encounter for immunization: Secondary | ICD-10-CM | POA: Diagnosis not present

## 2022-03-19 DIAGNOSIS — E6609 Other obesity due to excess calories: Secondary | ICD-10-CM

## 2022-03-19 DIAGNOSIS — Z0000 Encounter for general adult medical examination without abnormal findings: Secondary | ICD-10-CM

## 2022-03-19 LAB — POCT URINALYSIS DIPSTICK
Bilirubin, UA: NEGATIVE
Blood, UA: NEGATIVE
Glucose, UA: NEGATIVE
Ketones, UA: NEGATIVE
Nitrite, UA: NEGATIVE
Protein, UA: NEGATIVE
Spec Grav, UA: 1.015 (ref 1.010–1.025)
Urobilinogen, UA: 1 E.U./dL
pH, UA: 7 (ref 5.0–8.0)

## 2022-03-19 MED ORDER — AMLODIPINE BESYLATE 5 MG PO TABS: 5.0000 mg | ORAL_TABLET | Freq: Every day | ORAL | 1 refills | Status: DC

## 2022-03-19 NOTE — Progress Notes (Signed)
Jeri Cos Llittleton,acting as a Neurosurgeon for Gwynneth Aliment, MD.,have documented all relevant documentation on the behalf of Gwynneth Aliment, MD,as directed by  Gwynneth Aliment, MD while in the presence of Gwynneth Aliment, MD.  This visit occurred during the SARS-CoV-2 public health emergency.  Safety protocols were in place, including screening questions prior to the visit, additional usage of staff PPE, and extensive cleaning of exam room while observing appropriate contact time as indicated for disinfecting solutions.  Subjective:     Patient ID: Shannon Mendoza , female    DOB: 1955/12/12 , 66 y.o.   MRN: [id number]   Chief Complaint  Patient presents with   Annual Exam    HPI  She is here today for a full physical exam. Patient reports compliance with her meds. She is followed by GYN for her pelvic exams. She denies headaches, chest pain and shortness of breath. Patient does not have any questions or concerns at this time.   Hypertension This is a chronic problem. The current episode started more than 1 year ago. The problem has been gradually improving since onset. The problem is controlled. Pertinent negatives include no blurred vision or orthopnea. Risk factors for coronary artery disease include sedentary lifestyle, obesity and post-menopausal state.    Past Medical History:  Diagnosis Date   HTN (hypertension)    Obesity      Family History  Problem Relation Age of Onset   Hypertension Mother    Diabetes Mother    Cancer Mother    Stroke Father    Cancer Sister      Current Outpatient Medications:    Cholecalciferol (VITAMIN D-3) 125 MCG (5000 UT) TABS, Take 1 tablet by mouth daily., Disp: , Rfl:    Multiple Vitamins-Minerals (CENTRUM SILVER 50+WOMEN PO), Take by mouth., Disp: , Rfl:    telmisartan-hydrochlorothiazide (MICARDIS HCT) 80-25 MG tablet, TAKE 1 TABLET BY MOUTH EVERY DAY, Disp: 90 tablet, Rfl: 2   amLODipine (NORVASC) 5 MG tablet, Take 1 tablet (5 mg  total) by mouth daily., Disp: 90 tablet, Rfl: 1  Current Facility-Administered Medications:    cyanocobalamin ((VITAMIN B-12)) injection 1,000 mcg, 1,000 mcg, Intramuscular, Once, Dorothyann Peng, MD   Allergies  Allergen Reactions   Codeine Nausea And Vomiting      The patient states she uses post menopausal status for birth control. Last LMP was No LMP recorded. Patient is postmenopausal.. Negative for Dysmenorrhea and Negative for Menorrhagia. Negative for: breast discharge, breast lump(s), breast pain and breast self exam. Associated symptoms include abnormal vaginal bleeding. Pertinent negatives include abnormal bleeding (hematology), anxiety, decreased libido, depression, difficulty falling sleep, dyspareunia, history of infertility, nocturia, sexual dysfunction, sleep disturbances, urinary incontinence, urinary urgency, vaginal discharge and vaginal itching. Diet regular.The patient states her exercise level is  moderate, 5 days per week, walking.   . The patient's tobacco use is:  Social History   Tobacco Use  Smoking Status Never  Smokeless Tobacco Never  . She has been exposed to passive smoke. The patient's alcohol use is:  Social History   Substance and Sexual Activity  Alcohol Use Not Currently    Review of Systems  Constitutional: Negative.   HENT: Negative.    Eyes: Negative.  Negative for blurred vision.  Respiratory: Negative.    Cardiovascular: Negative.  Negative for orthopnea.  Gastrointestinal: Negative.   Endocrine: Negative.   Genitourinary: Negative.   Musculoskeletal: Negative.   Skin: Negative.   Allergic/Immunologic: Negative.   Neurological: Negative.  Hematological: Negative.   Psychiatric/Behavioral: Negative.      Today's Vitals   03/19/22 0941 03/19/22 1013  BP: (!) 142/90 132/90  Pulse: 84   Temp: 98.1 F (36.7 C)   Weight: 217 lb 12.8 oz (98.8 kg)   Height: 5\' 7"  (1.702 m)   PainSc: 0-No pain    Body mass index is 34.11 kg/m.  Wt  Readings from Last 3 Encounters:  03/19/22 217 lb 12.8 oz (98.8 kg)  01/08/22 219 lb (99.3 kg)  12/11/21 219 lb 6.4 oz (99.5 kg)    Objective:  Physical Exam Vitals and nursing note reviewed.  Constitutional:      Appearance: Normal appearance.  HENT:     Head: Normocephalic and atraumatic.     Right Ear: Tympanic membrane, ear canal and external ear normal.     Left Ear: Tympanic membrane, ear canal and external ear normal.     Nose: Nose normal.     Mouth/Throat:     Mouth: Mucous membranes are moist.     Pharynx: Oropharynx is clear.  Eyes:     Extraocular Movements: Extraocular movements intact.     Conjunctiva/sclera: Conjunctivae normal.     Pupils: Pupils are equal, round, and reactive to light.  Cardiovascular:     Rate and Rhythm: Normal rate and regular rhythm.     Pulses: Normal pulses.     Heart sounds: Normal heart sounds.  Pulmonary:     Effort: Pulmonary effort is normal.     Breath sounds: Normal breath sounds.  Chest:  Breasts:    Tanner Score is 5.     Right: Normal.     Left: Normal.  Abdominal:     General: Bowel sounds are normal.     Palpations: Abdomen is soft.  Genitourinary:    Comments: deferred Musculoskeletal:        General: Normal range of motion.     Cervical back: Normal range of motion and neck supple.  Skin:    General: Skin is warm and dry.  Neurological:     General: No focal deficit present.     Mental Status: She is alert and oriented to person, place, and time.  Psychiatric:        Mood and Affect: Mood normal.        Behavior: Behavior normal.     Assessment And Plan:     1. Encounter for general adult medical examination w/o abnormal findings Comments: A full exam was performed. Importance of monthly self breast exams was discussed with patient. PATIENT IS ADVISED TO GET 30-45 MINUTES REGULAR EXERCISE NO LESS THAN FOUR TO FIVE DAYS PER WEEK - BOTH WEIGHTBEARING EXERCISES AND AEROBIC ARE RECOMMENDED.  PATIENT IS ADVISED TO  FOLLOW A HEALTHY DIET WITH AT LEAST SIX FRUITS/VEGGIES PER DAY, DECREASE INTAKE OF RED MEAT, AND TO INCREASE FISH INTAKE TO TWO DAYS PER WEEK.  MEATS/FISH SHOULD NOT BE FRIED, BAKED OR BROILED IS PREFERABLE.  IT IS ALSO IMPORTANT TO CUT BACK ON YOUR SUGAR INTAKE. PLEASE AVOID ANYTHING WITH ADDED SUGAR, CORN SYRUP OR OTHER SWEETENERS. IF YOU MUST USE A SWEETENER, YOU CAN TRY STEVIA. IT IS ALSO IMPORTANT TO AVOID ARTIFICIALLY SWEETENERS AND DIET BEVERAGES. LASTLY, I SUGGEST WEARING SPF 50 SUNSCREEN ON EXPOSED PARTS AND ESPECIALLY WHEN IN THE DIRECT SUNLIGHT FOR AN EXTENDED PERIOD OF TIME.  PLEASE AVOID FAST FOOD RESTAURANTS AND INCREASE YOUR WATER INTAKE.  - CBC - Hemoglobin A1c - Lipid panel - COMPLETE METABOLIC PANEL WITH GFR  2. Essential hypertension, benign Comments: Chronic, uncontrolled.  Repeat BP improved, but not yet at goal. Goal BP<130/80. EKG performed, NSR w/o acute changes. She is encouraged to follow a low sodium diet. I will give her an opportunity to work on lifestyle changes. I will have her rto in 3 months for a nurse visit.   - POCT Urinalysis Dipstick (81002) - EKG 12-Lead - Microalbumin / Creatinine Urine Ratio  3. Class 1 obesity due to excess calories with serious comorbidity and body mass index (BMI) of 34.0 to 34.9 in adult Comments: She is encouraged to aim for at least 150 minutes of exercise per week, while striving for BMI<30 to decrease cardiac risk. She plans to join the [facility] in the near future.   4. Encounter for screening mammogram for malignant neoplasm of breast Comments: I will refer her to Breast center for yearly mammogram.   5. Special screening for malignant neoplasm of colon Comments: I will refer her to GI for CRC screening.  - Ambulatory referral to Gastroenterology - MM DIGITAL SCREENING BILATERAL; Future  6. Immunization due - Pneumococcal conjugate vaccine 20-valent (Prevnar-20)  Patient was given opportunity to ask questions. Patient  verbalized understanding of the plan and was able to repeat key elements of the plan. All questions were answered to their satisfaction.   I, Gwynneth Aliment, MD, have reviewed all documentation for this visit. The documentation on 03/19/22 for the exam, diagnosis, procedures, and orders are all accurate and complete.   THE PATIENT IS ENCOURAGED TO PRACTICE SOCIAL DISTANCING DUE TO THE COVID-19 PANDEMIC.

## 2022-03-19 NOTE — Patient Instructions (Signed)

## 2022-03-27 LAB — CBC
HCT: 38.3 % (ref 35.0–45.0)
Hemoglobin: 12.9 g/dL (ref 11.7–15.5)
MCH: 28.2 pg (ref 27.0–33.0)
MCHC: 33.7 g/dL (ref 32.0–36.0)
MCV: 83.8 fL (ref 80.0–100.0)
MPV: 11.7 fL (ref 7.5–12.5)
Platelets: 258 10*3/uL (ref 140–400)
RBC: 4.57 10*6/uL (ref 3.80–5.10)
RDW: 13.2 % (ref 11.0–15.0)
WBC: 4.9 10*3/uL (ref 3.8–10.8)

## 2022-03-27 LAB — HEMOGLOBIN A1C
Hgb A1c MFr Bld: 6.5 % of total Hgb — ABNORMAL HIGH (ref <5.7)
Mean Plasma Glucose: 140 mg/dL
eAG (mmol/L): 7.7 mmol/L

## 2022-03-27 LAB — MICROALBUMIN / CREATININE URINE RATIO
Creatinine, Urine: 55 mg/dL (ref 20–275)
Microalb Creat Ratio: 5 mcg/mg creat (ref <30)
Microalb, Ur: 0.3 mg/dL

## 2022-03-27 LAB — COMPLETE METABOLIC PANEL WITH GFR
AG Ratio: 1.6 (calc) (ref 1.0–2.5)
ALT: 19 U/L (ref 6–29)
AST: 17 U/L (ref 10–35)
Albumin: 4.2 g/dL (ref 3.6–5.1)
Alkaline phosphatase (APISO): 65 U/L (ref 37–153)
BUN: 14 mg/dL (ref 7–25)
CO2: 28 mmol/L (ref 20–32)
Calcium: 9.8 mg/dL (ref 8.6–10.4)
Chloride: 103 mmol/L (ref 98–110)
Creat: 0.77 mg/dL (ref 0.50–1.05)
Globulin: 2.7 g/dL (calc) (ref 1.9–3.7)
Glucose, Bld: 106 mg/dL — ABNORMAL HIGH (ref 65–99)
Potassium: 3.5 mmol/L (ref 3.5–5.3)
Sodium: 140 mmol/L (ref 135–146)
Total Bilirubin: 0.6 mg/dL (ref 0.2–1.2)
Total Protein: 6.9 g/dL (ref 6.1–8.1)
eGFR: 86 mL/min/{1.73_m2} (ref >=60)

## 2022-03-27 LAB — LIPID PANEL
Cholesterol: 206 mg/dL — ABNORMAL HIGH (ref <200)
HDL: 51 mg/dL (ref >=50)
LDL Cholesterol (Calc): 135 mg/dL (calc) — ABNORMAL HIGH
Non-HDL Cholesterol (Calc): 155 mg/dL (calc) — ABNORMAL HIGH (ref <130)
Total CHOL/HDL Ratio: 4 (calc) (ref <5.0)
Triglycerides: 94 mg/dL (ref <150)

## 2022-03-27 LAB — PAT ID TIQ DOC: Test Affected: 6517

## 2022-03-27 LAB — HOUSE ACCOUNT TRACKING

## 2022-07-25 LAB — HM MAMMOGRAPHY: HM Mammogram: NORMAL (ref 0–4)

## 2022-08-03 LAB — HM PAP SMEAR: HPV, high-risk: NEGATIVE

## 2022-09-18 ENCOUNTER — Ambulatory Visit: Payer: 59 | Admitting: Internal Medicine

## 2022-09-18 ENCOUNTER — Encounter: Payer: Self-pay | Admitting: Internal Medicine

## 2022-09-18 VITALS — BP 140/80 | HR 91 | Temp 98.5°F | Ht 65.2 in | Wt 205.4 lb

## 2022-09-18 DIAGNOSIS — E6609 Other obesity due to excess calories: Secondary | ICD-10-CM

## 2022-09-18 DIAGNOSIS — Z1211 Encounter for screening for malignant neoplasm of colon: Secondary | ICD-10-CM

## 2022-09-18 DIAGNOSIS — Z6833 Body mass index (BMI) 33.0-33.9, adult: Secondary | ICD-10-CM

## 2022-09-18 DIAGNOSIS — R7309 Other abnormal glucose: Secondary | ICD-10-CM | POA: Diagnosis not present

## 2022-09-18 DIAGNOSIS — Z23 Encounter for immunization: Secondary | ICD-10-CM

## 2022-09-18 DIAGNOSIS — I10 Essential (primary) hypertension: Secondary | ICD-10-CM

## 2022-09-18 LAB — COMPREHENSIVE METABOLIC PANEL
Albumin: 4.4 (ref 3.5–5.0)
Calcium: 10.5 (ref 8.7–10.7)
Globulin: 2.5
eGFR: 71

## 2022-09-18 LAB — BASIC METABOLIC PANEL
BUN: 14 (ref 4–21)
CO2: 31 — AB (ref 13–22)
Chloride: 101 (ref 99–108)
Creatinine: 0.9 (ref 0.5–1.1)
Glucose: 87
Potassium: 3.2 mEq/L — AB (ref 3.5–5.1)
Sodium: 140 (ref 137–147)

## 2022-09-18 LAB — HEPATIC FUNCTION PANEL
ALT: 16 U/L (ref 7–35)
AST: 15 (ref 13–35)
Alkaline Phosphatase: 63 (ref 25–125)
Bilirubin, Total: 0.4

## 2022-09-18 LAB — HEMOGLOBIN A1C: Hemoglobin A1C: 6.2

## 2022-09-18 NOTE — Patient Instructions (Signed)
Hypertension, Adult ?Hypertension is another name for high blood pressure. High blood pressure forces your heart to work harder to pump blood. This can cause problems over time. ?There are two numbers in a blood pressure reading. There is a top number (systolic) over a bottom number (diastolic). It is best to have a blood pressure that is below 120/80. ?What are the causes? ?The cause of this condition is not known. Some other conditions can lead to high blood pressure. ?What increases the risk? ?Some lifestyle factors can make you more likely to develop high blood pressure: ?Smoking. ?Not getting enough exercise or physical activity. ?Being overweight. ?Having too much fat, sugar, calories, or salt (sodium) in your diet. ?Drinking too much alcohol. ?Other risk factors include: ?Having any of these conditions: ?Heart disease. ?Diabetes. ?High cholesterol. ?Kidney disease. ?Obstructive sleep apnea. ?Having a family history of high blood pressure and high cholesterol. ?Age. The risk increases with age. ?Stress. ?What are the signs or symptoms? ?High blood pressure may not cause symptoms. Very high blood pressure (hypertensive crisis) may cause: ?Headache. ?Fast or uneven heartbeats (palpitations). ?Shortness of breath. ?Nosebleed. ?Vomiting or feeling like you may vomit (nauseous). ?Changes in how you see. ?Very bad chest pain. ?Feeling dizzy. ?Seizures. ?How is this treated? ?This condition is treated by making healthy lifestyle changes, such as: ?Eating healthy foods. ?Exercising more. ?Drinking less alcohol. ?Your doctor may prescribe medicine if lifestyle changes do not help enough and if: ?Your top number is above 130. ?Your bottom number is above 80. ?Your personal target blood pressure may vary. ?Follow these instructions at home: ?Eating and drinking ? ?If told, follow the DASH eating plan. To follow this plan: ?Fill one half of your plate at each meal with fruits and vegetables. ?Fill one fourth of your plate  at each meal with whole grains. Whole grains include whole-wheat pasta, brown rice, and whole-grain bread. ?Eat or drink low-fat dairy products, such as skim milk or low-fat yogurt. ?Fill one fourth of your plate at each meal with low-fat (lean) proteins. Low-fat proteins include fish, chicken without skin, eggs, beans, and tofu. ?Avoid fatty meat, cured and processed meat, or chicken with skin. ?Avoid pre-made or processed food. ?Limit the amount of salt in your diet to less than 1,500 mg each day. ?Do not drink alcohol if: ?Your doctor tells you not to drink. ?You are pregnant, may be pregnant, or are planning to become pregnant. ?If you drink alcohol: ?Limit how much you have to: ?0-1 drink a day for women. ?0-2 drinks a day for men. ?Know how much alcohol is in your drink. In the [place] one drink equals one 12 oz bottle of beer (355 mL), one 5 oz glass of wine (148 mL), or one 1? oz glass of hard liquor (44 mL). ?Lifestyle ? ?Work with your doctor to stay at a healthy weight or to lose weight. Ask your doctor what the best weight is for you. ?Get at least 30 minutes of exercise that causes your heart to beat faster (aerobic exercise) most days of the week. This may include walking, swimming, or biking. ?Get at least 30 minutes of exercise that strengthens your muscles (resistance exercise) at least 3 days a week. This may include lifting weights or doing Pilates. ?Do not smoke or use any products that contain nicotine or tobacco. If you need help quitting, ask your doctor. ?Check your blood pressure at home as told by your doctor. ?Keep all follow-up visits. ?Medicines ?Take over-the-counter and prescription medicines   only as told by your doctor. Follow directions carefully. ?Do not skip doses of blood pressure medicine. The medicine does not work as well if you skip doses. Skipping doses also puts you at risk for problems. ?Ask your doctor about side effects or reactions to medicines that you should watch  for. ?Contact a doctor if: ?You think you are having a reaction to the medicine you are taking. ?You have headaches that keep coming back. ?You feel dizzy. ?You have swelling in your ankles. ?You have trouble with your vision. ?Get help right away if: ?You get a very bad headache. ?You start to feel mixed up (confused). ?You feel weak or numb. ?You feel faint. ?You have very bad pain in your: ?Chest. ?Belly (abdomen). ?You vomit more than once. ?You have trouble breathing. ?These symptoms may be an emergency. Get help right away. Call 911. ?Do not wait to see if the symptoms will go away. ?Do not drive yourself to the hospital. ?Summary ?Hypertension is another name for high blood pressure. ?High blood pressure forces your heart to work harder to pump blood. ?For most people, a normal blood pressure is less than 120/80. ?Making healthy choices can help lower blood pressure. If your blood pressure does not get lower with healthy choices, you may need to take medicine. ?This information is not intended to replace advice given to you by your health care provider. Make sure you discuss any questions you have with your health care provider. ?Document Revised: 07/19/2021 Document Reviewed: 07/19/2021 ?Elsevier Patient Education ? 2023 Elsevier Inc. ? ?

## 2022-09-18 NOTE — Progress Notes (Signed)
Jeri Cos Llittleton,acting as a Neurosurgeon for Gwynneth Aliment, MD.,have documented all relevant documentation on the behalf of Gwynneth Aliment, MD,as directed by  Gwynneth Aliment, MD while in the presence of Gwynneth Aliment, MD.    Subjective:     Patient ID: Shannon Mendoza , female    DOB: 07-13-1956 , 66 y.o.   MRN: [id number]   Chief Complaint  Patient presents with   Hypertension    HPI  Patient presents today for a f/u on her bp.  She reports compliance with meds. She denies headaches, chest pain and shortness of breath.    Hypertension This is a chronic problem. The current episode started more than 1 year ago. The problem has been gradually improving since onset. The problem is uncontrolled. Pertinent negatives include no blurred vision. Risk factors for coronary artery disease include sedentary lifestyle and post-menopausal state. Past treatments include angiotensin blockers, diuretics and calcium channel blockers. The current treatment provides moderate improvement.     Past Medical History:  Diagnosis Date   HTN (hypertension)    Obesity      Family History  Problem Relation Age of Onset   Hypertension Mother    Diabetes Mother    Cancer Mother    Stroke Father    Cancer Sister      Current Outpatient Medications:    amLODipine (NORVASC) 5 MG tablet, Take 1 tablet (5 mg total) by mouth daily., Disp: 90 tablet, Rfl: 1   Cholecalciferol (VITAMIN D-3) 125 MCG (5000 UT) TABS, Take 1 tablet by mouth daily., Disp: , Rfl:    Multiple Vitamins-Minerals (CENTRUM SILVER 50+WOMEN PO), Take by mouth., Disp: , Rfl:    telmisartan-hydrochlorothiazide (MICARDIS HCT) 80-25 MG tablet, TAKE 1 TABLET BY MOUTH EVERY DAY, Disp: 90 tablet, Rfl: 2  Current Facility-Administered Medications:    cyanocobalamin ((VITAMIN B-12)) injection 1,000 mcg, 1,000 mcg, Intramuscular, Once, Dorothyann Peng, MD   Allergies  Allergen Reactions   Codeine Nausea And Vomiting     Review of  Systems  Constitutional: Negative.   Eyes: Negative.  Negative for blurred vision.  Respiratory: Negative.    Cardiovascular: Negative.   Musculoskeletal: Negative.   Skin: Negative.   Neurological: Negative.   Psychiatric/Behavioral: Negative.       Today's Vitals   09/18/22 1116 09/18/22 1201  BP: (!) 138/90 (!) 140/80  Pulse: 91   Temp: 98.5 F (36.9 C)   Weight: 205 lb 6.4 oz (93.2 kg)   Height: 5' 5.2" (1.656 m)   PainSc: 0-No pain    Body mass index is 33.97 kg/m.  Wt Readings from Last 3 Encounters:  09/18/22 205 lb 6.4 oz (93.2 kg)  03/19/22 217 lb 12.8 oz (98.8 kg)  01/08/22 219 lb (99.3 kg)     Objective:  Physical Exam Vitals and nursing note reviewed.  Constitutional:      Appearance: Normal appearance.  HENT:     Head: Normocephalic and atraumatic.     Nose:     Comments: Masked     Mouth/Throat:     Comments: Masked  Eyes:     Extraocular Movements: Extraocular movements intact.  Cardiovascular:     Rate and Rhythm: Normal rate and regular rhythm.     Heart sounds: Normal heart sounds.  Pulmonary:     Effort: Pulmonary effort is normal.     Breath sounds: Normal breath sounds.  Musculoskeletal:     Cervical back: Normal range of motion.  Skin:  General: Skin is warm.  Neurological:     General: No focal deficit present.     Mental Status: She is alert.  Psychiatric:        Mood and Affect: Mood normal.        Behavior: Behavior normal.       Assessment And Plan:     1. Essential hypertension, benign Comments: Chronic, uncontrolled. Importance of salt restriction, med compliance and increased activity was d/w patient. She will f/u in 6 wks for nurse visit. - COMPLETE METABOLIC PANEL WITH GFR  2. Other abnormal glucose Comments: Her a1c has been elevated, advised to limit her intake of sugary beverages and foods. - Hemoglobin A1c  3. Class 1 obesity due to excess calories with body mass index (BMI) of 33.0 to 33.9 in adult,  unspecified whether serious comorbidity present Comments: She is encouraged to aim for at least 150 minutes of exercise/week, while striving for BMI<30 to decrease cardiac risk.  4. Immunization due - Tdap vaccine greater than or equal to 7yo IM  5. Screen for colon cancer Comments: She has yet to have colonoscopy due to intolerance of the prep. She agrees to [employer] at this time. Encouraged to complete by year end. - Cologuard   Patient was given opportunity to ask questions. Patient verbalized understanding of the plan and was able to repeat key elements of the plan. All questions were answered to their satisfaction.   I, Gwynneth Aliment, MD, have reviewed all documentation for this visit. The documentation on 09/18/22 for the exam, diagnosis, procedures, and orders are all accurate and complete.   IF YOU HAVE BEEN REFERRED TO A SPECIALIST, IT MAY TAKE 1-2 WEEKS TO SCHEDULE/PROCESS THE REFERRAL. IF YOU HAVE NOT HEARD FROM US/SPECIALIST IN TWO WEEKS, PLEASE GIVE [place] A CALL AT [phone].   THE PATIENT IS ENCOURAGED TO PRACTICE SOCIAL DISTANCING DUE TO THE COVID-19 PANDEMIC.

## 2022-10-02 ENCOUNTER — Encounter: Payer: Self-pay | Admitting: Internal Medicine

## 2022-10-09 LAB — COLOGUARD: COLOGUARD: NEGATIVE

## 2022-11-05 ENCOUNTER — Encounter: Payer: 59 | Admitting: Internal Medicine

## 2022-11-05 VITALS — BP 136/84 | HR 89 | Temp 98.1°F | Ht 65.0 in | Wt 205.0 lb

## 2022-11-05 DIAGNOSIS — I10 Essential (primary) hypertension: Secondary | ICD-10-CM

## 2022-11-05 NOTE — Patient Instructions (Signed)
Hypertension, Adult Hypertension is another name for high blood pressure. High blood pressure forces your heart to work harder to pump blood. This can cause problems over time. There are two numbers in a blood pressure reading. There is a top number (systolic) over a bottom number (diastolic). It is best to have a blood pressure that is below 120/80. What are the causes? The cause of this condition is not known. Some other conditions can lead to high blood pressure. What increases the risk? Some lifestyle factors can make you more likely to develop high blood pressure: Smoking. Not getting enough exercise or physical activity. Being overweight. Having too much fat, sugar, calories, or salt (sodium) in your diet. Drinking too much alcohol. Other risk factors include: Having any of these conditions: Heart disease. Diabetes. High cholesterol. Kidney disease. Obstructive sleep apnea. Having a family history of high blood pressure and high cholesterol. Age. The risk increases with age. Stress. What are the signs or symptoms? High blood pressure may not cause symptoms. Very high blood pressure (hypertensive crisis) may cause: Headache. Fast or uneven heartbeats (palpitations). Shortness of breath. Nosebleed. Vomiting or feeling like you may vomit (nauseous). Changes in how you see. Very bad chest pain. Feeling dizzy. Seizures. How is this treated? This condition is treated by making healthy lifestyle changes, such as: Eating healthy foods. Exercising more. Drinking less alcohol. Your doctor may prescribe medicine if lifestyle changes do not help enough and if: Your top number is above 130. Your bottom number is above 80. Your personal target blood pressure may vary. Follow these instructions at home: Eating and drinking  If told, follow the DASH eating plan. To follow this plan: Fill one half of your plate at each meal with fruits and vegetables. Fill one fourth of your plate  at each meal with whole grains. Whole grains include whole-wheat pasta, brown rice, and whole-grain bread. Eat or drink low-fat dairy products, such as skim milk or low-fat yogurt. Fill one fourth of your plate at each meal with low-fat (lean) proteins. Low-fat proteins include fish, chicken without skin, eggs, beans, and tofu. Avoid fatty meat, cured and processed meat, or chicken with skin. Avoid pre-made or processed food. Limit the amount of salt in your diet to less than 1,500 mg each day. Do not drink alcohol if: Your doctor tells you not to drink. You are pregnant, may be pregnant, or are planning to become pregnant. If you drink alcohol: Limit how much you have to: 0-1 drink a day for women. 0-2 drinks a day for men. Know how much alcohol is in your drink. In the [place] one drink equals one 12 oz bottle of beer (355 mL), one 5 oz glass of wine (148 mL), or one 1 oz glass of hard liquor (44 mL). Lifestyle  Work with your doctor to stay at a healthy weight or to lose weight. Ask your doctor what the best weight is for you. Get at least 30 minutes of exercise that causes your heart to beat faster (aerobic exercise) most days of the week. This may include walking, swimming, or biking. Get at least 30 minutes of exercise that strengthens your muscles (resistance exercise) at least 3 days a week. This may include lifting weights or doing Pilates. Do not smoke or use any products that contain nicotine or tobacco. If you need help quitting, ask your doctor. Check your blood pressure at home as told by your doctor. Keep all follow-up visits. Medicines Take over-the-counter and prescription medicines  only as told by your doctor. Follow directions carefully. ?Do not skip doses of blood pressure medicine. The medicine does not work as well if you skip doses. Skipping doses also puts you at risk for problems. ?Ask your doctor about side effects or reactions to medicines that you should watch  for. ?Contact a doctor if: ?You think you are having a reaction to the medicine you are taking. ?You have headaches that keep coming back. ?You feel dizzy. ?You have swelling in your ankles. ?You have trouble with your vision. ?Get help right away if: ?You get a very bad headache. ?You start to feel mixed up (confused). ?You feel weak or numb. ?You feel faint. ?You have very bad pain in your: ?Chest. ?Belly (abdomen). ?You vomit more than once. ?You have trouble breathing. ?These symptoms may be an emergency. Get help right away. Call 911. ?Do not wait to see if the symptoms will go away. ?Do not drive yourself to the hospital. ?Summary ?Hypertension is another name for high blood pressure. ?High blood pressure forces your heart to work harder to pump blood. ?For most people, a normal blood pressure is less than 120/80. ?Making healthy choices can help lower blood pressure. If your blood pressure does not get lower with healthy choices, you may need to take medicine. ?This information is not intended to replace advice given to you by your health care provider. Make sure you discuss any questions you have with your health care provider. ?Document Revised: 07/19/2021 Document Reviewed: 07/19/2021 ?Elsevier Patient Education ? 2023 Elsevier Inc. ? ?

## 2022-11-05 NOTE — Progress Notes (Signed)
Pt presents today for bpc. She currently takes Amlodipine 5MG  in the morning between 8-9 at night. Telmisartan- HCTZ 80-25 she reports taking in the morning between 8-1. She is trying her best with water intake. At least 3 bottles of water a day. She does not exercise currently. She has not had anything for breakfast yet.  BP Readings from Last 3 Encounters:  12/30/22 (!) 138/96  11/05/22 136/84  09/18/22 (!) 140/80  Patient will come back in 8 weeks for f/u appt.   I have provided oversight concerning  Shannon Mendoza,CMA evaluation and treatment of this patient's health issues addressed during today's encounter. I agree with the assessment and plan as outlined in the note above.   Pt encouraged to comply w/ meds as directed. Importance of dietary compliance also addressed. If needed, will increase amlodipine at next visit. Shannon Wisdom N. Allyne Gee, MD [facility]

## 2022-11-08 ENCOUNTER — Other Ambulatory Visit: Payer: Self-pay | Admitting: Internal Medicine

## 2022-11-08 ENCOUNTER — Encounter: Payer: Self-pay | Admitting: Internal Medicine

## 2022-12-30 ENCOUNTER — Encounter: Payer: Self-pay | Admitting: Internal Medicine

## 2022-12-30 ENCOUNTER — Ambulatory Visit (INDEPENDENT_AMBULATORY_CARE_PROVIDER_SITE_OTHER): Payer: 59 | Admitting: Internal Medicine

## 2022-12-30 VITALS — BP 138/96 | HR 83 | Temp 98.1°F | Ht 65.0 in | Wt 208.8 lb

## 2022-12-30 DIAGNOSIS — E785 Hyperlipidemia, unspecified: Secondary | ICD-10-CM | POA: Diagnosis not present

## 2022-12-30 DIAGNOSIS — E1169 Type 2 diabetes mellitus with other specified complication: Secondary | ICD-10-CM

## 2022-12-30 DIAGNOSIS — Z2821 Immunization not carried out because of patient refusal: Secondary | ICD-10-CM

## 2022-12-30 DIAGNOSIS — B353 Tinea pedis: Secondary | ICD-10-CM

## 2022-12-30 DIAGNOSIS — E538 Deficiency of other specified B group vitamins: Secondary | ICD-10-CM

## 2022-12-30 DIAGNOSIS — E6609 Other obesity due to excess calories: Secondary | ICD-10-CM | POA: Diagnosis not present

## 2022-12-30 DIAGNOSIS — Z6834 Body mass index (BMI) 34.0-34.9, adult: Secondary | ICD-10-CM

## 2022-12-30 DIAGNOSIS — I10 Essential (primary) hypertension: Secondary | ICD-10-CM | POA: Diagnosis not present

## 2022-12-30 MED ORDER — CYANOCOBALAMIN 1000 MCG/ML IJ SOLN
1000.0000 ug | Freq: Once | INTRAMUSCULAR | Status: AC
  Administered 2022-12-30: 1000 ug via INTRAMUSCULAR

## 2022-12-30 MED ORDER — MOUNJARO 2.5 MG/0.5ML ~~LOC~~ SOAJ: 2.5000 mg | SUBCUTANEOUS | 0 refills | Status: DC

## 2022-12-30 MED ORDER — AMLODIPINE BESYLATE 10 MG PO TABS: 10.0000 mg | ORAL_TABLET | Freq: Every day | ORAL | 11 refills | Status: DC

## 2022-12-30 MED ORDER — TERBINAFINE HCL 1 % EX CREA: 1.0000 | TOPICAL_CREAM | Freq: Two times a day (BID) | CUTANEOUS | 0 refills | Status: DC

## 2022-12-30 NOTE — Patient Instructions (Signed)
Hypertension, Adult ?Hypertension is another name for high blood pressure. High blood pressure forces your heart to work harder to pump blood. This can cause problems over time. ?There are two numbers in a blood pressure reading. There is a top number (systolic) over a bottom number (diastolic). It is best to have a blood pressure that is below 120/80. ?What are the causes? ?The cause of this condition is not known. Some other conditions can lead to high blood pressure. ?What increases the risk? ?Some lifestyle factors can make you more likely to develop high blood pressure: ?Smoking. ?Not getting enough exercise or physical activity. ?Being overweight. ?Having too much fat, sugar, calories, or salt (sodium) in your diet. ?Drinking too much alcohol. ?Other risk factors include: ?Having any of these conditions: ?Heart disease. ?Diabetes. ?High cholesterol. ?Kidney disease. ?Obstructive sleep apnea. ?Having a family history of high blood pressure and high cholesterol. ?Age. The risk increases with age. ?Stress. ?What are the signs or symptoms? ?High blood pressure may not cause symptoms. Very high blood pressure (hypertensive crisis) may cause: ?Headache. ?Fast or uneven heartbeats (palpitations). ?Shortness of breath. ?Nosebleed. ?Vomiting or feeling like you may vomit (nauseous). ?Changes in how you see. ?Very bad chest pain. ?Feeling dizzy. ?Seizures. ?How is this treated? ?This condition is treated by making healthy lifestyle changes, such as: ?Eating healthy foods. ?Exercising more. ?Drinking less alcohol. ?Your doctor may prescribe medicine if lifestyle changes do not help enough and if: ?Your top number is above 130. ?Your bottom number is above 80. ?Your personal target blood pressure may vary. ?Follow these instructions at home: ?Eating and drinking ? ?If told, follow the DASH eating plan. To follow this plan: ?Fill one half of your plate at each meal with fruits and vegetables. ?Fill one fourth of your plate  at each meal with whole grains. Whole grains include whole-wheat pasta, brown rice, and whole-grain bread. ?Eat or drink low-fat dairy products, such as skim milk or low-fat yogurt. ?Fill one fourth of your plate at each meal with low-fat (lean) proteins. Low-fat proteins include fish, chicken without skin, eggs, beans, and tofu. ?Avoid fatty meat, cured and processed meat, or chicken with skin. ?Avoid pre-made or processed food. ?Limit the amount of salt in your diet to less than 1,500 mg each day. ?Do not drink alcohol if: ?Your doctor tells you not to drink. ?You are pregnant, may be pregnant, or are planning to become pregnant. ?If you drink alcohol: ?Limit how much you have to: ?0-1 drink a day for women. ?0-2 drinks a day for men. ?Know how much alcohol is in your drink. In the [place] one drink equals one 12 oz bottle of beer (355 mL), one 5 oz glass of wine (148 mL), or one 1? oz glass of hard liquor (44 mL). ?Lifestyle ? ?Work with your doctor to stay at a healthy weight or to lose weight. Ask your doctor what the best weight is for you. ?Get at least 30 minutes of exercise that causes your heart to beat faster (aerobic exercise) most days of the week. This may include walking, swimming, or biking. ?Get at least 30 minutes of exercise that strengthens your muscles (resistance exercise) at least 3 days a week. This may include lifting weights or doing Pilates. ?Do not smoke or use any products that contain nicotine or tobacco. If you need help quitting, ask your doctor. ?Check your blood pressure at home as told by your doctor. ?Keep all follow-up visits. ?Medicines ?Take over-the-counter and prescription medicines   only as told by your doctor. Follow directions carefully. ?Do not skip doses of blood pressure medicine. The medicine does not work as well if you skip doses. Skipping doses also puts you at risk for problems. ?Ask your doctor about side effects or reactions to medicines that you should watch  for. ?Contact a doctor if: ?You think you are having a reaction to the medicine you are taking. ?You have headaches that keep coming back. ?You feel dizzy. ?You have swelling in your ankles. ?You have trouble with your vision. ?Get help right away if: ?You get a very bad headache. ?You start to feel mixed up (confused). ?You feel weak or numb. ?You feel faint. ?You have very bad pain in your: ?Chest. ?Belly (abdomen). ?You vomit more than once. ?You have trouble breathing. ?These symptoms may be an emergency. Get help right away. Call 911. ?Do not wait to see if the symptoms will go away. ?Do not drive yourself to the hospital. ?Summary ?Hypertension is another name for high blood pressure. ?High blood pressure forces your heart to work harder to pump blood. ?For most people, a normal blood pressure is less than 120/80. ?Making healthy choices can help lower blood pressure. If your blood pressure does not get lower with healthy choices, you may need to take medicine. ?This information is not intended to replace advice given to you by your health care provider. Make sure you discuss any questions you have with your health care provider. ?Document Revised: 07/19/2021 Document Reviewed: 07/19/2021 ?Elsevier Patient Education ? 2023 Elsevier Inc. ? ?

## 2022-12-30 NOTE — Progress Notes (Signed)
Barnet Glasgow Martin,acting as a Education administrator for Maximino Greenland, MD.,have documented all relevant documentation on the behalf of Maximino Greenland, MD,as directed by  Maximino Greenland, MD while in the presence of Maximino Greenland, MD.    Subjective:     Patient ID: Shannon Mendoza , female    DOB: 1955/12/22 , 67 y.o.   MRN: [id number]   Chief Complaint  Patient presents with   Hypertension    HPI  Patient presents today for a f/u on her bp.  She reports compliance with meds. She denies headaches, chest pain and shortness of breath. Patient states she thinks she has a fungus on her left foot. She also states her bp will probably be high because she had smoked sausage for breakfast.  Hypertension This is a chronic problem. The current episode started more than 1 year ago. The problem has been gradually improving since onset. The problem is uncontrolled. Pertinent negatives include no blurred vision. Risk factors for coronary artery disease include sedentary lifestyle and post-menopausal state. Past treatments include angiotensin blockers, diuretics and calcium channel blockers. The current treatment provides moderate improvement.     Past Medical History:  Diagnosis Date   HTN (hypertension)    Obesity      Family History  Problem Relation Age of Onset   Hypertension Mother    Diabetes Mother    Cancer Mother    Stroke Father    Cancer Sister      Current Outpatient Medications:    amLODipine (NORVASC) 10 MG tablet, Take 1 tablet (10 mg total) by mouth daily., Disp: 30 tablet, Rfl: 11   Cholecalciferol (VITAMIN D-3) 125 MCG (5000 UT) TABS, Take 1 tablet by mouth daily., Disp: , Rfl:    Multiple Vitamins-Minerals (CENTRUM SILVER 50+WOMEN PO), Take by mouth., Disp: , Rfl:    telmisartan-hydrochlorothiazide (MICARDIS HCT) 80-25 MG tablet, TAKE 1 TABLET BY MOUTH EVERY DAY, Disp: 90 tablet, Rfl: 2   terbinafine (LAMISIL) 1 % cream, Apply 1 Application topically 2 (two) times daily., Disp:  30 g, Rfl: 0   tirzepatide (MOUNJARO) 2.5 MG/0.5ML Pen, Inject 2.5 mg into the skin once a week., Disp: 2 mL, Rfl: 0  Current Facility-Administered Medications:    cyanocobalamin ((VITAMIN B-12)) injection 1,000 mcg, 1,000 mcg, Intramuscular, Once, [place] Chard, MD   Allergies  Allergen Reactions   Codeine Nausea And Vomiting     Review of Systems  Constitutional: Negative.   Eyes:  Negative for blurred vision.  Respiratory: Negative.    Cardiovascular: Negative.   Gastrointestinal: Negative.   Musculoskeletal: Negative.   Skin: Negative.   Neurological: Negative.   Psychiatric/Behavioral: Negative.       Today's Vitals   12/30/22 1149 12/30/22 1205  BP: (!) 140/100 (!) 138/96  Pulse: 83   Temp: 98.1 F (36.7 C)   TempSrc: Oral   Weight: 208 lb 12.8 oz (94.7 kg)   Height: 5\' 5"  (1.651 m)   PainSc: 0-No pain    Body mass index is 34.75 kg/m.  BP Readings from Last 3 Encounters:  12/30/22 (!) 138/96  11/05/22 136/84  09/18/22 (!) 140/80    Objective:  Physical Exam Vitals and nursing note reviewed.  Constitutional:      Appearance: Normal appearance. She is obese.  HENT:     Head: Normocephalic and atraumatic.     Nose:     Comments: Masked     Mouth/Throat:     Comments: Masked   Eyes:  Extraocular Movements: Extraocular movements intact.  Cardiovascular:     Rate and Rhythm: Normal rate and regular rhythm.     Heart sounds: Normal heart sounds.  Pulmonary:     Effort: Pulmonary effort is normal.     Breath sounds: Normal breath sounds.  Musculoskeletal:     Cervical back: Normal range of motion.  Skin:    General: Skin is warm.     Findings: No rash.     Comments: Left foot w/ scaly skin on heel, lateral sides of foot No erythema Interdigital areas are clear  Neurological:     General: No focal deficit present.     Mental Status: She is alert.  Psychiatric:        Mood and Affect: Mood normal.        Behavior: Behavior normal.        Assessment And Plan:     1. Essential hypertension, benign Comments: Uncontrolled. I will increase amlodipine to 10mg  nightly. Pt reminded BP goal < 120/80. Encouraged to decrease her intake of processed meats.  She agrees to f/u in 3 weeks for a nurse visit.  - Basic metabolic panel; Future  2. Dyslipidemia associated with type 2 diabetes mellitus ([place]) Comments: We reviewed previous a1cs, she has had two 6.5 readings. She wishes to learn how to use glucometer at next visit. Also agrees to starting [facility].  She denies family history of thyroid cancer. Once approved, she will bring in sample so she can learn how to self administer the medication. Possible side effects were discussed with the patient.  - tirzepatide [facility]) 2.5 MG/0.5ML Pen; Inject 2.5 mg into the skin once a week.  Dispense: 2 mL; Refill: 0 - Hemoglobin A1c; Future - Basic metabolic panel; Future - Basic metabolic panel - Hemoglobin A1c  3. Tinea pedis of left foot Comments: I will send rx terbinafine cream to apply to affected area twice daily. - terbinafine (LAMISIL) 1 % cream; Apply 1 Application topically 2 (two) times daily.  Dispense: 30 g; Refill: 0  4. B12 deficiency - cyanocobalamin (VITAMIN B12) injection 1,000 mcg - Vitamin B12; Future - Vitamin B12  5. Class 1 obesity due to excess calories with serious comorbidity and body mass index (BMI) of 34.0 to 34.9 in adult Comments: She is encouraged to aim for at least 150 minutes/week, while striving for BMI<30 to decrease cardiac risk.  6. Herpes zoster vaccination declined   Patient was given opportunity to ask questions. Patient verbalized understanding of the plan and was able to repeat key elements of the plan. All questions were answered to their satisfaction.   I, Maximino Greenland, MD, have reviewed all documentation for this visit. The documentation on 12/30/22 for the exam, diagnosis, procedures, and orders are all accurate and complete.   IF YOU  HAVE BEEN REFERRED TO A SPECIALIST, IT MAY TAKE 1-2 WEEKS TO SCHEDULE/PROCESS THE REFERRAL. IF YOU HAVE NOT HEARD FROM US/SPECIALIST IN TWO WEEKS, PLEASE GIVE [place] A CALL AT [phone] X [phone].   THE PATIENT IS ENCOURAGED TO PRACTICE SOCIAL DISTANCING DUE TO THE COVID-19 PANDEMIC.

## 2022-12-31 LAB — LAB REPORT - SCANNED: Hemoglobin A1c: 6.4

## 2023-01-21 ENCOUNTER — Ambulatory Visit: Payer: 59

## 2023-01-21 VITALS — BP 130/92 | HR 97 | Temp 98.5°F | Ht 65.0 in | Wt 208.0 lb

## 2023-01-21 DIAGNOSIS — I10 Essential (primary) hypertension: Secondary | ICD-10-CM

## 2023-01-21 NOTE — Patient Instructions (Signed)
Hypertension, Adult ?Hypertension is another name for high blood pressure. High blood pressure forces your heart to work harder to pump blood. This can cause problems over time. ?There are two numbers in a blood pressure reading. There is a top number (systolic) over a bottom number (diastolic). It is best to have a blood pressure that is below 120/80. ?What are the causes? ?The cause of this condition is not known. Some other conditions can lead to high blood pressure. ?What increases the risk? ?Some lifestyle factors can make you more likely to develop high blood pressure: ?Smoking. ?Not getting enough exercise or physical activity. ?Being overweight. ?Having too much fat, sugar, calories, or salt (sodium) in your diet. ?Drinking too much alcohol. ?Other risk factors include: ?Having any of these conditions: ?Heart disease. ?Diabetes. ?High cholesterol. ?Kidney disease. ?Obstructive sleep apnea. ?Having a family history of high blood pressure and high cholesterol. ?Age. The risk increases with age. ?Stress. ?What are the signs or symptoms? ?High blood pressure may not cause symptoms. Very high blood pressure (hypertensive crisis) may cause: ?Headache. ?Fast or uneven heartbeats (palpitations). ?Shortness of breath. ?Nosebleed. ?Vomiting or feeling like you may vomit (nauseous). ?Changes in how you see. ?Very bad chest pain. ?Feeling dizzy. ?Seizures. ?How is this treated? ?This condition is treated by making healthy lifestyle changes, such as: ?Eating healthy foods. ?Exercising more. ?Drinking less alcohol. ?Your doctor may prescribe medicine if lifestyle changes do not help enough and if: ?Your top number is above 130. ?Your bottom number is above 80. ?Your personal target blood pressure may vary. ?Follow these instructions at home: ?Eating and drinking ? ?If told, follow the DASH eating plan. To follow this plan: ?Fill one half of your plate at each meal with fruits and vegetables. ?Fill one fourth of your plate  at each meal with whole grains. Whole grains include whole-wheat pasta, brown rice, and whole-grain bread. ?Eat or drink low-fat dairy products, such as skim milk or low-fat yogurt. ?Fill one fourth of your plate at each meal with low-fat (lean) proteins. Low-fat proteins include fish, chicken without skin, eggs, beans, and tofu. ?Avoid fatty meat, cured and processed meat, or chicken with skin. ?Avoid pre-made or processed food. ?Limit the amount of salt in your diet to less than 1,500 mg each day. ?Do not drink alcohol if: ?Your doctor tells you not to drink. ?You are pregnant, may be pregnant, or are planning to become pregnant. ?If you drink alcohol: ?Limit how much you have to: ?0-1 drink a day for women. ?0-2 drinks a day for men. ?Know how much alcohol is in your drink. In the [place] one drink equals one 12 oz bottle of beer (355 mL), one 5 oz glass of wine (148 mL), or one 1? oz glass of hard liquor (44 mL). ?Lifestyle ? ?Work with your doctor to stay at a healthy weight or to lose weight. Ask your doctor what the best weight is for you. ?Get at least 30 minutes of exercise that causes your heart to beat faster (aerobic exercise) most days of the week. This may include walking, swimming, or biking. ?Get at least 30 minutes of exercise that strengthens your muscles (resistance exercise) at least 3 days a week. This may include lifting weights or doing Pilates. ?Do not smoke or use any products that contain nicotine or tobacco. If you need help quitting, ask your doctor. ?Check your blood pressure at home as told by your doctor. ?Keep all follow-up visits. ?Medicines ?Take over-the-counter and prescription medicines   only as told by your doctor. Follow directions carefully. ?Do not skip doses of blood pressure medicine. The medicine does not work as well if you skip doses. Skipping doses also puts you at risk for problems. ?Ask your doctor about side effects or reactions to medicines that you should watch  for. ?Contact a doctor if: ?You think you are having a reaction to the medicine you are taking. ?You have headaches that keep coming back. ?You feel dizzy. ?You have swelling in your ankles. ?You have trouble with your vision. ?Get help right away if: ?You get a very bad headache. ?You start to feel mixed up (confused). ?You feel weak or numb. ?You feel faint. ?You have very bad pain in your: ?Chest. ?Belly (abdomen). ?You vomit more than once. ?You have trouble breathing. ?These symptoms may be an emergency. Get help right away. Call 911. ?Do not wait to see if the symptoms will go away. ?Do not drive yourself to the hospital. ?Summary ?Hypertension is another name for high blood pressure. ?High blood pressure forces your heart to work harder to pump blood. ?For most people, a normal blood pressure is less than 120/80. ?Making healthy choices can help lower blood pressure. If your blood pressure does not get lower with healthy choices, you may need to take medicine. ?This information is not intended to replace advice given to you by your health care provider. Make sure you discuss any questions you have with your health care provider. ?Document Revised: 07/19/2021 Document Reviewed: 07/19/2021 ?Elsevier Patient Education ? 2023 Elsevier Inc. ? ?

## 2023-01-21 NOTE — Progress Notes (Signed)
Patient presents today for BP check, patient is currently taking amLODipine 10mg  PM, telmisartan-hctz 80-25mg  AM. Patient reports taking cold medicine - coricidin HBP max strength cold and flu.  BP Readings from Last 3 Encounters:  01/21/23 (!) 140/98  12/30/22 (!) 138/96  11/05/22 136/84  Per provider - NV in 3wks

## 2023-02-11 ENCOUNTER — Ambulatory Visit (INDEPENDENT_AMBULATORY_CARE_PROVIDER_SITE_OTHER): Payer: 59

## 2023-02-11 VITALS — BP 142/90 | HR 85 | Temp 98.1°F | Ht 65.0 in | Wt 209.2 lb

## 2023-02-11 DIAGNOSIS — I10 Essential (primary) hypertension: Secondary | ICD-10-CM

## 2023-02-11 DIAGNOSIS — E538 Deficiency of other specified B group vitamins: Secondary | ICD-10-CM | POA: Diagnosis not present

## 2023-02-11 MED ORDER — CYANOCOBALAMIN 1000 MCG/ML IJ SOLN
1000.0000 ug | Freq: Once | INTRAMUSCULAR | Status: AC
  Administered 2023-02-11: 1000 ug via INTRAMUSCULAR

## 2023-02-11 NOTE — Progress Notes (Signed)
Pt presents today for bpc. She currently takes Amlodipine 10MG  in the morning after work & telmisartan HCTZ 80-25 at night before work. Denies headache, chest pain, dizziness, SOB. She admits she has started back walking. She reports drinking 35-45 ounces of water a day. She has taken amlodipine for today before appointment she will take telmisartan tonight on the way to work.

## 2023-02-25 ENCOUNTER — Ambulatory Visit: Payer: Self-pay

## 2023-03-20 ENCOUNTER — Other Ambulatory Visit: Payer: Self-pay

## 2023-03-20 NOTE — Progress Notes (Signed)
Patient attempted to be outreached by Frederic Jericho, PharmD Candidate on 03/20/23 to discuss hypertension. Left voicemail for patient to return our call at their convenience at [phone].  Frederic Jericho, Student-PharmD

## 2023-03-24 ENCOUNTER — Telehealth: Payer: Self-pay

## 2023-03-24 ENCOUNTER — Encounter: Payer: Self-pay | Admitting: Internal Medicine

## 2023-03-24 ENCOUNTER — Ambulatory Visit (INDEPENDENT_AMBULATORY_CARE_PROVIDER_SITE_OTHER): Payer: 59 | Admitting: Internal Medicine

## 2023-03-24 VITALS — BP 138/90 | HR 81 | Temp 97.9°F | Ht 65.0 in | Wt 209.6 lb

## 2023-03-24 DIAGNOSIS — E6609 Other obesity due to excess calories: Secondary | ICD-10-CM

## 2023-03-24 DIAGNOSIS — E1169 Type 2 diabetes mellitus with other specified complication: Secondary | ICD-10-CM

## 2023-03-24 DIAGNOSIS — I10 Essential (primary) hypertension: Secondary | ICD-10-CM | POA: Diagnosis not present

## 2023-03-24 DIAGNOSIS — E538 Deficiency of other specified B group vitamins: Secondary | ICD-10-CM

## 2023-03-24 DIAGNOSIS — E785 Hyperlipidemia, unspecified: Secondary | ICD-10-CM

## 2023-03-24 DIAGNOSIS — Z0000 Encounter for general adult medical examination without abnormal findings: Secondary | ICD-10-CM

## 2023-03-24 DIAGNOSIS — E049 Nontoxic goiter, unspecified: Secondary | ICD-10-CM | POA: Diagnosis not present

## 2023-03-24 DIAGNOSIS — H6121 Impacted cerumen, right ear: Secondary | ICD-10-CM

## 2023-03-24 DIAGNOSIS — Z6834 Body mass index (BMI) 34.0-34.9, adult: Secondary | ICD-10-CM

## 2023-03-24 MED ORDER — CYANOCOBALAMIN 1000 MCG/ML IJ SOLN
1000.0000 ug | Freq: Once | INTRAMUSCULAR | Status: AC
Start: 2023-03-24 — End: 2023-03-25
  Administered 2023-03-25: 1000 ug via INTRAMUSCULAR

## 2023-03-24 NOTE — Progress Notes (Signed)
Subjective:    Patient ID: Shannon Mendoza , female    DOB: October 21, 1955 , 67 y.o.   MRN: [id number]  Chief Complaint  Patient presents with   Annual Exam   Hypertension   Diabetes    HPI  She is here today for a full physical exam. Patient reports compliance with her meds. She is followed by Soyla Murphy for her pelvic exams. She denies headaches, chest pain and shortness of breath. Patient does not have any questions or concerns at this time.   Hypertension This is a chronic problem. The current episode started more than 1 year ago. The problem has been gradually improving since onset. The problem is controlled. Pertinent negatives include no blurred vision or orthopnea. Risk factors for coronary artery disease include sedentary lifestyle, obesity and post-menopausal state.     Past Medical History:  Diagnosis Date   HTN (hypertension)    Obesity      Family History  Problem Relation Age of Onset   Hypertension Mother    Diabetes Mother    Cancer Mother    Stroke Father    Cancer Sister      Current Outpatient Medications:    amLODipine (NORVASC) 10 MG tablet, Take 1 tablet (10 mg total) by mouth daily., Disp: 30 tablet, Rfl: 11   Cholecalciferol (VITAMIN D-3) 125 MCG (5000 UT) TABS, Take 1 tablet by mouth daily., Disp: , Rfl:    Multiple Vitamins-Minerals (CENTRUM SILVER 50+WOMEN PO), Take by mouth., Disp: , Rfl:    telmisartan-hydrochlorothiazide (MICARDIS HCT) 80-25 MG tablet, TAKE 1 TABLET BY MOUTH EVERY DAY, Disp: 90 tablet, Rfl: 2   terbinafine (LAMISIL) 1 % cream, Apply 1 Application topically 2 (two) times daily. (Patient not taking: Reported on 03/24/2023), Disp: 30 g, Rfl: 0   tirzepatide (MOUNJARO) 2.5 MG/0.5ML Pen, Inject 2.5 mg into the skin once a week. (Patient not taking: Reported on 03/24/2023), Disp: 2 mL, Rfl: 0  Current Facility-Administered Medications:    cyanocobalamin ((VITAMIN B-12)) injection 1,000 mcg, 1,000 mcg, Intramuscular, Once,  Dorothyann Peng, MD   cyanocobalamin (VITAMIN B12) injection 1,000 mcg, 1,000 mcg, Intramuscular, Once, Dorothyann Peng, MD   Allergies  Allergen Reactions   Codeine Nausea And Vomiting      The patient states she uses post menopausal status for birth control. No LMP recorded. Patient is postmenopausal.. Negative for Dysmenorrhea. Negative for: breast discharge, breast lump(s), breast pain and breast self exam. Associated symptoms include abnormal vaginal bleeding. Pertinent negatives include abnormal bleeding (hematology), anxiety, decreased libido, depression, difficulty falling sleep, dyspareunia, history of infertility, nocturia, sexual dysfunction, sleep disturbances, urinary incontinence, urinary urgency, vaginal discharge and vaginal itching. Diet regular.The patient states her exercise level is Current Exercise Habits: Home exercise routine, Type of exercise: walking, Time (Minutes): 60, Frequency (Times/Week): 6, Weekly Exercise (Minutes/Week): 360, Intensity: Moderate Exercise limited by: None identified. The patient's tobacco use is:  Social History   Tobacco Use  Smoking Status Never  Smokeless Tobacco Never  . She has been exposed to passive smoke. The patient's alcohol use is:  Social History   Substance and Sexual Activity  Alcohol Use Not Currently    Review of Systems  Constitutional: Negative.   HENT: Negative.    Eyes: Negative.  Negative for blurred vision.  Respiratory: Negative.    Cardiovascular: Negative.  Negative for orthopnea.  Gastrointestinal: Negative.   Endocrine: Negative.   Genitourinary: Negative.   Musculoskeletal: Negative.   Skin: Negative.   Allergic/Immunologic: Negative.  Neurological: Negative.   Hematological: Negative.   Psychiatric/Behavioral: Negative.       Today's Vitals   03/24/23 1106 03/24/23 1122  BP: (!) 140/96 (!) 138/90  Pulse: 81   Temp: 97.9 F (36.6 C)   SpO2: 98%   Weight: 209 lb 9.6 oz (95.1 kg)   Height: 5\' 5"   (1.651 m)    Body mass index is 34.88 kg/m.  Wt Readings from Last 3 Encounters:  03/24/23 209 lb 9.6 oz (95.1 kg)  02/11/23 209 lb 3.2 oz (94.9 kg)  01/21/23 208 lb (94.3 kg)     Objective:  Physical Exam Vitals and nursing note reviewed.  Constitutional:      Appearance: Normal appearance.  HENT:     Head: Normocephalic and atraumatic.     Right Ear: Ear canal and external ear normal. There is impacted cerumen.     Left Ear: Tympanic membrane, ear canal and external ear normal.     Nose: Nose normal.     Mouth/Throat:     Mouth: Mucous membranes are moist.     Pharynx: Oropharynx is clear.  Eyes:     Extraocular Movements: Extraocular movements intact.     Conjunctiva/sclera: Conjunctivae normal.     Pupils: Pupils are equal, round, and reactive to light.  Neck:     Thyroid: Thyromegaly present.  Cardiovascular:     Rate and Rhythm: Normal rate and regular rhythm.     Pulses: Normal pulses.          Dorsalis pedis pulses are 2+ on the right side and 2+ on the left side.     Heart sounds: Normal heart sounds.  Pulmonary:     Effort: Pulmonary effort is normal.     Breath sounds: Normal breath sounds.  Chest:  Breasts:    Tanner Score is 5.     Right: Normal.     Left: Normal.  Abdominal:     General: Abdomen is flat. Bowel sounds are normal.     Palpations: Abdomen is soft.  Genitourinary:    Comments: deferred Musculoskeletal:        General: Normal range of motion.     Cervical back: Normal range of motion and neck supple.  Feet:     Right foot:     Protective Sensation: 5 sites tested.  5 sites sensed.     Skin integrity: Skin integrity normal.     Toenail Condition: Right toenails are normal.     Left foot:     Protective Sensation: 5 sites tested.  5 sites sensed.     Skin integrity: Skin integrity normal.     Toenail Condition: Left toenails are normal.  Skin:    General: Skin is warm and dry.  Neurological:     General: No focal deficit present.      Mental Status: She is alert and oriented to person, place, and time.  Psychiatric:        Mood and Affect: Mood normal.        Behavior: Behavior normal.      Assessment And Plan:     1. Encounter for general adult medical examination w/o abnormal findings Comments: A full exam was performed. Importance of monthly self breast exams was discussed with the patient.  PATIENT IS ADVISED TO GET 30-45 MINUTES REGULAR EXERCISE NO LESS THAN FOUR TO FIVE DAYS PER WEEK - BOTH WEIGHTBEARING EXERCISES AND AEROBIC ARE RECOMMENDED.  PATIENT IS ADVISED TO FOLLOW A HEALTHY DIET WITH  AT LEAST SIX FRUITS/VEGGIES PER DAY, DECREASE INTAKE OF RED MEAT, AND TO INCREASE FISH INTAKE TO TWO DAYS PER WEEK.  MEATS/FISH SHOULD NOT BE FRIED, BAKED OR BROILED IS PREFERABLE.  IT IS ALSO IMPORTANT TO CUT BACK ON YOUR SUGAR INTAKE. PLEASE AVOID ANYTHING WITH ADDED SUGAR, CORN SYRUP OR OTHER SWEETENERS. IF YOU MUST USE A SWEETENER, YOU CAN TRY STEVIA. IT IS ALSO IMPORTANT TO AVOID ARTIFICIALLY SWEETENERS AND DIET BEVERAGES. LASTLY, I SUGGEST WEARING SPF 50 SUNSCREEN ON EXPOSED PARTS AND ESPECIALLY WHEN IN THE DIRECT SUNLIGHT FOR AN EXTENDED PERIOD OF TIME.  PLEASE AVOID FAST FOOD RESTAURANTS AND INCREASE YOUR WATER INTAKE. - CBC - Lipid panel - Hemoglobin A1c - CMP w Anion Gap (STAT/Sunquest-performed on-site) - TSH  2. Essential hypertension, benign Comments: Chronic, fair control. EKG performed, NSR w/o acute changes. She will c/w telmisartan/hct 80/25mg  and amlodipine 10mg  daily. Advised to follow low sodium diet. - EKG 12-Lead - Microalbumin / Creatinine Urine Ratio - Urinalysis  3. Dyslipidemia associated with type 2 diabetes mellitus (HCC) Comments: Chronic, LDL goal < 70.  Unfortunately, Greggory Keen is not yet approved. There appears to be an issue with her insurance. Will check on PA. Diabetic foot exam was performed. I DISCUSSED WITH THE PATIENT AT LENGTH REGARDING THE GOALS OF GLYCEMIC CONTROL AND POSSIBLE  LONG-TERM COMPLICATIONS.  I  ALSO STRESSED THE IMPORTANCE OF COMPLIANCE WITH HOME GLUCOSE MONITORING, DIETARY RESTRICTIONS INCLUDING AVOIDANCE OF SUGARY DRINKS/PROCESSED FOODS,  ALONG WITH REGULAR EXERCISE.  I  ALSO STRESSED THE IMPORTANCE OF ANNUAL EYE EXAMS, SELF FOOT CARE AND COMPLIANCE WITH OFFICE VISITS.  - Microalbumin / Creatinine Urine Ratio - Urinalysis  4. Goiter Comments: I will refer her for thyroid ultrasound for further evaluation. - US THYROID; Future  5. B12 deficiency Comments: I will check vitamin B12 level today. She states her last injection was a week ago.  She was also given vitamin B12 IM x 1. - Vitamin B12 - cyanocobalamin (VITAMIN B12) injection 1,000 mcg  6. Right ear impacted cerumen Comments: She declines ear irrigation today. Advised to use Debrox drops over the counter.  7. Class 1 obesity due to excess calories with serious comorbidity and body mass index (BMI) of 34.0 to 34.9 in adult She is encouraged to strive for BMI less than 30 to decrease cardiac risk. Advised to aim for at least 150 minutes of exercise per week.    Return for 1 year physical, 4 month DM. Patient was given opportunity to ask questions. Patient verbalized understanding of the plan and was able to repeat key elements of the plan. All questions were answered to their satisfaction.   I, Gwynneth Aliment, MD, have reviewed all documentation for this visit. The documentation on 03/24/23 for the exam, diagnosis, procedures, and orders are all accurate and complete.

## 2023-03-24 NOTE — Patient Instructions (Signed)

## 2023-03-24 NOTE — Telephone Encounter (Signed)
error 

## 2023-03-25 ENCOUNTER — Other Ambulatory Visit: Payer: Self-pay | Admitting: Internal Medicine

## 2023-03-25 DIAGNOSIS — E538 Deficiency of other specified B group vitamins: Secondary | ICD-10-CM | POA: Diagnosis not present

## 2023-03-25 LAB — HEMOGLOBIN A1C
Hgb A1c MFr Bld: 6.5 % of total Hgb — ABNORMAL HIGH (ref <5.7)
Mean Plasma Glucose: 140 mg/dL
eAG (mmol/L): 7.7 mmol/L

## 2023-03-25 LAB — MICROALBUMIN / CREATININE URINE RATIO
Creatinine, Urine: 34 mg/dL (ref 20–275)
Microalb, Ur: <0.2 mg/dL

## 2023-03-25 LAB — COMPREHENSIVE METABOLIC PANEL
AG Ratio: 1.6 (calc) (ref 1.0–2.5)
ALT: 22 U/L (ref 6–29)
AST: 18 U/L (ref 10–35)
Albumin: 4.4 g/dL (ref 3.6–5.1)
Alkaline phosphatase (APISO): 67 U/L (ref 37–153)
BUN: 12 mg/dL (ref 7–25)
CO2: 30 mmol/L (ref 20–32)
Calcium: 10.4 mg/dL (ref 8.6–10.4)
Chloride: 102 mmol/L (ref 98–110)
Creat: 0.81 mg/dL (ref 0.50–1.05)
Globulin: 2.7 g/dL (calc) (ref 1.9–3.7)
Glucose, Bld: 98 mg/dL (ref 65–99)
Potassium: 3.7 mmol/L (ref 3.5–5.3)
Sodium: 141 mmol/L (ref 135–146)
Total Bilirubin: 0.5 mg/dL (ref 0.2–1.2)
Total Protein: 7.1 g/dL (ref 6.1–8.1)

## 2023-03-25 LAB — LIPID PANEL
Cholesterol: 215 mg/dL — ABNORMAL HIGH (ref <200)
HDL: 53 mg/dL (ref >=50)
LDL Cholesterol (Calc): 139 mg/dL (calc) — ABNORMAL HIGH
Non-HDL Cholesterol (Calc): 162 mg/dL (calc) — ABNORMAL HIGH (ref <130)
Total CHOL/HDL Ratio: 4.1 (calc) (ref <5.0)
Triglycerides: 109 mg/dL (ref <150)

## 2023-03-25 LAB — CBC
HCT: 35.9 % (ref 35.0–45.0)
Hemoglobin: 12 g/dL (ref 11.7–15.5)
MCH: 28.2 pg (ref 27.0–33.0)
MCHC: 33.4 g/dL (ref 32.0–36.0)
MCV: 84.5 fL (ref 80.0–100.0)
MPV: 11.3 fL (ref 7.5–12.5)
Platelets: 236 10*3/uL (ref 140–400)
RBC: 4.25 10*6/uL (ref 3.80–5.10)
RDW: 13.3 % (ref 11.0–15.0)
WBC: 5.1 10*3/uL (ref 3.8–10.8)

## 2023-03-25 LAB — URINALYSIS
Bilirubin Urine: NEGATIVE
Glucose, UA: NEGATIVE
Hgb urine dipstick: NEGATIVE
Ketones, ur: NEGATIVE
Leukocytes,Ua: NEGATIVE
Nitrite: NEGATIVE
Protein, ur: NEGATIVE
Specific Gravity, Urine: 1.008 (ref 1.001–1.035)
pH: 7.5 (ref 5.0–8.0)

## 2023-03-25 LAB — TSH: TSH: 1.14 mIU/L (ref 0.40–4.50)

## 2023-03-25 LAB — VITAMIN B12: Vitamin B-12: 342 pg/mL (ref 200–1100)

## 2023-03-25 MED ORDER — METFORMIN HCL ER 500 MG PO TB24: ORAL_TABLET | ORAL | 1 refills | Status: DC

## 2023-04-17 ENCOUNTER — Other Ambulatory Visit: Payer: Self-pay | Admitting: Internal Medicine

## 2023-04-21 ENCOUNTER — Telehealth: Payer: Self-pay

## 2023-04-21 NOTE — Telephone Encounter (Signed)
Prior auth for mounjaro 2.5mg  has been submitted through cover my meds. PA has been approved through 04/20/26. YL,RMA

## 2023-04-25 ENCOUNTER — Other Ambulatory Visit: Payer: Self-pay | Admitting: Internal Medicine

## 2023-04-25 DIAGNOSIS — E1169 Type 2 diabetes mellitus with other specified complication: Secondary | ICD-10-CM

## 2023-04-25 MED ORDER — ATORVASTATIN CALCIUM 20 MG PO TABS: ORAL_TABLET | ORAL | 1 refills | Status: DC

## 2023-04-30 ENCOUNTER — Encounter: Payer: Self-pay | Admitting: Internal Medicine

## 2023-04-30 ENCOUNTER — Other Ambulatory Visit: Payer: Self-pay | Admitting: Internal Medicine

## 2023-04-30 ENCOUNTER — Ambulatory Visit (INDEPENDENT_AMBULATORY_CARE_PROVIDER_SITE_OTHER): Payer: 59 | Admitting: Internal Medicine

## 2023-04-30 VITALS — BP 138/104 | HR 71 | Temp 98.0°F | Ht 65.0 in | Wt 206.4 lb

## 2023-04-30 DIAGNOSIS — Z79899 Other long term (current) drug therapy: Secondary | ICD-10-CM

## 2023-04-30 DIAGNOSIS — E1169 Type 2 diabetes mellitus with other specified complication: Secondary | ICD-10-CM

## 2023-04-30 DIAGNOSIS — Z6834 Body mass index (BMI) 34.0-34.9, adult: Secondary | ICD-10-CM

## 2023-04-30 DIAGNOSIS — N182 Chronic kidney disease, stage 2 (mild): Secondary | ICD-10-CM | POA: Diagnosis not present

## 2023-04-30 DIAGNOSIS — E6609 Other obesity due to excess calories: Secondary | ICD-10-CM

## 2023-04-30 DIAGNOSIS — E785 Hyperlipidemia, unspecified: Secondary | ICD-10-CM | POA: Diagnosis not present

## 2023-04-30 DIAGNOSIS — I129 Hypertensive chronic kidney disease with stage 1 through stage 4 chronic kidney disease, or unspecified chronic kidney disease: Secondary | ICD-10-CM

## 2023-04-30 DIAGNOSIS — E538 Deficiency of other specified B group vitamins: Secondary | ICD-10-CM | POA: Diagnosis not present

## 2023-04-30 MED ORDER — CYANOCOBALAMIN 1000 MCG/ML IJ SOLN
1000.0000 ug | Freq: Once | INTRAMUSCULAR | Status: AC
Start: 2023-04-30 — End: 2023-04-30
  Administered 2023-04-30: 1000 ug via INTRAMUSCULAR

## 2023-04-30 NOTE — Patient Instructions (Signed)

## 2023-04-30 NOTE — Progress Notes (Signed)
I,Victoria T Deloria Lair, CMA,acting as a Neurosurgeon for Gwynneth Aliment, MD.,have documented all relevant documentation on the behalf of Gwynneth Aliment, MD,as directed by  Gwynneth Aliment, MD while in the presence of Gwynneth Aliment, MD.  Subjective:  Patient ID: Shannon Mendoza , female    DOB: 25-Sep-1956 , 67 y.o.   MRN: [id number]  Chief Complaint  Patient presents with   Diabetes   Hypertension    HPI  Patient presents today for Metformin 500MG  follow up. She was diagnosed with diabetes after her last visit. She reports compliance with medication. She reports the medication is doing well for her. She reports no specific questions or concerns.   She states she will soon schedule diabetic eye exam.    Hypertension This is a chronic problem. The current episode started more than 1 year ago. The problem has been gradually improving since onset. The problem is uncontrolled. Pertinent negatives include no blurred vision. Risk factors for coronary artery disease include sedentary lifestyle and post-menopausal state. Past treatments include angiotensin blockers, diuretics and calcium channel blockers. The current treatment provides moderate improvement.     Past Medical History:  Diagnosis Date   HTN (hypertension)    Obesity      Family History  Problem Relation Age of Onset   Hypertension Mother    Diabetes Mother    Cancer Mother    Stroke Father    Cancer Sister      Current Outpatient Medications:    amLODipine (NORVASC) 10 MG tablet, Take 1 tablet (10 mg total) by mouth daily., Disp: 30 tablet, Rfl: 11   atorvastatin (LIPITOR) 20 MG tablet, Take one tablet by mouth Monday - Saturday. Skip Sundays., Disp: 30 tablet, Rfl: 1   Cholecalciferol (VITAMIN D-3) 125 MCG (5000 UT) TABS, Take 1 tablet by mouth daily., Disp: , Rfl:    metFORMIN (GLUCOPHAGE-XR) 500 MG 24 hr tablet, TAKE ONE TABLET BY MOUTH DAILY WITH EVENING MEAL, Disp: 90 tablet, Rfl: 1   Multiple Vitamins-Minerals (CENTRUM  SILVER 50+WOMEN PO), Take by mouth., Disp: , Rfl:    telmisartan-hydrochlorothiazide (MICARDIS HCT) 80-25 MG tablet, TAKE 1 TABLET BY MOUTH EVERY DAY, Disp: 90 tablet, Rfl: 2   terbinafine (LAMISIL) 1 % cream, Apply 1 Application topically 2 (two) times daily. (Patient not taking: Reported on 03/24/2023), Disp: 30 g, Rfl: 0   tirzepatide (MOUNJARO) 2.5 MG/0.5ML Pen, Inject 2.5 mg into the skin once a week. (Patient not taking: Reported on 03/24/2023), Disp: 2 mL, Rfl: 0  Current Facility-Administered Medications:    cyanocobalamin ((VITAMIN B-12)) injection 1,000 mcg, 1,000 mcg, Intramuscular, Once, Dorothyann Peng, MD   Allergies  Allergen Reactions   Codeine Nausea And Vomiting     Review of Systems  Constitutional: Negative.   Eyes:  Negative for blurred vision.  Respiratory: Negative.    Cardiovascular: Negative.   Gastrointestinal: Negative.   Neurological: Negative.   Psychiatric/Behavioral: Negative.       Today's Vitals   04/30/23 1611 04/30/23 1636  BP: (!) 142/110 (!) 138/104  Pulse: 71   Temp: 98 F (36.7 C)   SpO2: 98%   Weight: 206 lb 6.4 oz (93.6 kg)   Height: 5\' 5"  (1.651 m)    Body mass index is 34.35 kg/m.  Wt Readings from Last 3 Encounters:  04/30/23 206 lb 6.4 oz (93.6 kg)  03/24/23 209 lb 9.6 oz (95.1 kg)  02/11/23 209 lb 3.2 oz (94.9 kg)    BP Readings from Last 3  Encounters:  04/30/23 (!) 138/104  03/24/23 (!) 138/90  02/11/23 (!) 142/90    Objective:  Physical Exam Vitals and nursing note reviewed.  Constitutional:      Appearance: Normal appearance.  HENT:     Head: Normocephalic and atraumatic.  Eyes:     Extraocular Movements: Extraocular movements intact.  Cardiovascular:     Rate and Rhythm: Normal rate and regular rhythm.     Heart sounds: Normal heart sounds.  Pulmonary:     Effort: Pulmonary effort is normal.     Breath sounds: Normal breath sounds.  Musculoskeletal:     Cervical back: Normal range of motion.  Skin:     General: Skin is warm.  Neurological:     General: No focal deficit present.     Mental Status: She is alert.  Psychiatric:        Mood and Affect: Mood normal.        Behavior: Behavior normal.         Assessment And Plan:  Dyslipidemia associated with type 2 diabetes mellitus (HCC) Assessment & Plan: Newly diagnosed. I will check f/u BMP today since starting metformin. She is reminded to stay well hydrated while on this medication. I plan to start GLP-1 agonist if covered by her insurance. She will f/u in 2-3 months for her next A1c. Will check urine studies as well.   Orders: -     Basic metabolic panel -     Microalbumin / creatinine urine ratio  Hypertensive nephropathy Assessment & Plan: Chronic, uncontrolled. Importance of dietary/medication compliance was stressed to the patient.  She will continue with telmisartan/hct 80/25mg  and amlodipine 10mg  diay. She is encouraged to avoid adding salt to her foods and to decrease her intake of packaged foods. May need Cardiology eval for ambulatory BP monitoring.   Chronic renal disease, stage II Assessment & Plan: Chronic. She is encouraged to stay well hydrated. I will check a GFR, Cr today.     B12 deficiency Assessment & Plan: She was given vitamin b12 IM x 1.   Orders: -     Cyanocobalamin  Class 1 obesity due to excess calories with serious comorbidity and body mass index (BMI) of 34.0 to 34.9 in adult  Drug therapy  She is encouraged to strive for BMI less than 30 to decrease cardiac risk. Advised to aim for at least 150 minutes of exercise per week.    Return if symptoms worsen or fail to improve.  Patient was given opportunity to ask questions. Patient verbalized understanding of the plan and was able to repeat key elements of the plan. All questions were answered to their satisfaction.   I, Gwynneth Aliment, MD, have reviewed all documentation for this visit. The documentation on 05/13/23 for the exam, diagnosis,  procedures, and orders are all accurate and complete.   IF YOU HAVE BEEN REFERRED TO A SPECIALIST, IT MAY TAKE 1-2 WEEKS TO SCHEDULE/PROCESS THE REFERRAL. IF YOU HAVE NOT HEARD FROM US/SPECIALIST IN TWO WEEKS, PLEASE GIVE [place] A CALL AT [phone].   THE PATIENT IS ENCOURAGED TO PRACTICE SOCIAL DISTANCING DUE TO THE COVID-19 PANDEMIC.

## 2023-05-01 LAB — BASIC METABOLIC PANEL WITH GFR
BUN: 15 mg/dL (ref 7–25)
CO2: 31 mmol/L (ref 20–32)
Calcium: 10.3 mg/dL (ref 8.6–10.4)
Chloride: 101 mmol/L (ref 98–110)
Creat: 0.93 mg/dL (ref 0.50–1.05)
Glucose, Bld: 87 mg/dL (ref 65–139)
Potassium: 3.3 mmol/L — ABNORMAL LOW (ref 3.5–5.3)
Sodium: 141 mmol/L (ref 135–146)
eGFR: 68 mL/min/{1.73_m2} (ref >=60)

## 2023-05-01 LAB — CLIENT EDUCATION TRACKING

## 2023-05-01 LAB — MICROALBUMIN / CREATININE URINE RATIO
Creatinine, Urine: 125 mg/dL (ref 20–275)
Microalb Creat Ratio: 6 mg/g creat (ref <30)
Microalb, Ur: 0.7 mg/dL

## 2023-05-13 DIAGNOSIS — E785 Hyperlipidemia, unspecified: Secondary | ICD-10-CM | POA: Insufficient documentation

## 2023-05-13 DIAGNOSIS — E538 Deficiency of other specified B group vitamins: Secondary | ICD-10-CM | POA: Insufficient documentation

## 2023-05-13 NOTE — Assessment & Plan Note (Signed)
She was given vitamin b12 IM x 1.

## 2023-05-13 NOTE — Assessment & Plan Note (Signed)
Newly diagnosed. I will check f/u BMP today since starting metformin. She is reminded to stay well hydrated while on this medication. I plan to start GLP-1 agonist if covered by her insurance. She will f/u in 2-3 months for her next A1c. Will check urine studies as well.

## 2023-05-13 NOTE — Assessment & Plan Note (Signed)
Chronic. She is encouraged to stay well hydrated. I will check a GFR, Cr today.

## 2023-05-13 NOTE — Assessment & Plan Note (Addendum)
Chronic, uncontrolled. Importance of dietary/medication compliance was stressed to the patient.  She will continue with telmisartan/hct 80/25mg  and amlodipine 10mg  diay. She is encouraged to avoid adding salt to her foods and to decrease her intake of packaged foods. May need Cardiology eval for ambulatory BP monitoring.

## 2023-05-17 ENCOUNTER — Other Ambulatory Visit: Payer: Self-pay | Admitting: Internal Medicine

## 2023-05-17 DIAGNOSIS — E1169 Type 2 diabetes mellitus with other specified complication: Secondary | ICD-10-CM

## 2023-06-16 ENCOUNTER — Other Ambulatory Visit: Payer: Self-pay | Admitting: Internal Medicine

## 2023-06-16 DIAGNOSIS — E1169 Type 2 diabetes mellitus with other specified complication: Secondary | ICD-10-CM

## 2023-06-16 DIAGNOSIS — E785 Hyperlipidemia, unspecified: Secondary | ICD-10-CM

## 2023-07-14 ENCOUNTER — Other Ambulatory Visit: Payer: Self-pay | Admitting: Pharmacist

## 2023-07-14 DIAGNOSIS — I10 Essential (primary) hypertension: Secondary | ICD-10-CM

## 2023-07-14 MED ORDER — TIRZEPATIDE 5 MG/0.5ML ~~LOC~~ SOAJ: 5.0000 mg | SUBCUTANEOUS | 2 refills | Status: DC

## 2023-07-14 MED ORDER — SPIRONOLACTONE 25 MG PO TABS: 12.5000 mg | ORAL_TABLET | Freq: Every day | ORAL | 3 refills | Status: DC

## 2023-07-14 NOTE — Patient Instructions (Signed)
Soley,   It was great talking to you today!  Come have labs drawn on October 7th. After that, start spironolactone 12.5 mg daily. Continue telmisartan/hydrochlorothiazide 80/25 mg daily.   Our goal blood pressure is less than 130/80. We prefer the Omron Upper Arm Series 3 blood pressure cuff, which is usually ~$35-40 at [employer], [employer], etc. Our [facility] offers an Omron brand upper arm monitor for $30-35, available without a prescription.   Check your blood pressure twice weekly, and any time you have concerning symptoms like headache, chest pain, dizziness, shortness of breath, or vision changes.   Our goal is less than 130/80.  To appropriately check your blood pressure, make sure you do the following:  1) Avoid caffeine, exercise, or tobacco products for 30 minutes before checking. Empty your bladder. 2) Sit with your back supported in a flat-backed chair. Rest your arm on something flat (arm of the chair, table, etc). 3) Sit still with your feet flat on the floor, resting, for at least 5 minutes.  4) Check your blood pressure. Take 1-2 readings.  5) Write down these readings and bring with you to any provider appointments.  Bring your home blood pressure machine with you to a provider's office for accuracy comparison at least once a year.   Make sure you take your blood pressure medications before you come to any office visit, even if you were asked to fast for labs.   Take care!  [name], PharmD, BCACP, CPP Clinical Pharmacist [facility] [phone]

## 2023-07-14 NOTE — Progress Notes (Signed)
Patient appearing on report for [employer] [facility] - Hypertension Control report due to last documented ambulatory blood pressure of 138/104 on 04/30/23. Next appointment with PCP is 07/28/23   Outreached patient to discuss hypertension control and medication management.   Current antihypertensives: telmisartan/hydrochlorothiazide 80/25 mg daily, prescribed amlodipine 10 mg daily but reports she is not taking. Denies lower extremity edema, but notes that she did not derive benefit from this medication so she discontinues. Notes she would like to take something different.   Patient does not have an automated upper arm home BP machine, but is amenable to purchasing one.   History of hypokalemia. Could be related to hydrochlorothiazide 25 mg daily, but is concurrently on telmisartan. Discussed with PCP, suspicion for primary aldosterone.    Assessment/Plan: - Currently uncontrolled - - Reviewed goal blood pressure <130/80 - Reviewed appropriate administration of medication regimen - Reviewed to check blood pressure daily , document, and provide at next provider visit. Discussed appropriate technique and preferred machines - Remove amlodipine from list. Patient picked up telmisartan/hydrochlorothiazide recently, so will avoid reducing hydrochlorothiazide dose at this time. Scheduled AM labs next week for aldosterone/renin. Patient counseled to start spironolactone 12.5 mg daily AFTER those labs have been drawn. Discussed with PCP, orders placed. - Patient also requests increasing Mounjaro to 5 mg weekly. Discussed with PCP, order sent.   PCP in 2 weeks for appointment and BMP after starting spironolactone. PharmD call 4 weeks after that.    [name], PharmD, BCACP, CPP Clinical Pharmacist [facility] [phone]

## 2023-07-21 ENCOUNTER — Other Ambulatory Visit: Payer: 59

## 2023-07-24 ENCOUNTER — Other Ambulatory Visit: Payer: Self-pay | Admitting: Internal Medicine

## 2023-07-24 LAB — TIQ-MISC

## 2023-07-24 LAB — ALDOSTERONE + RENIN ACTIVITY W/ RATIO

## 2023-07-24 LAB — HOUSE ACCOUNT TRACKING

## 2023-07-28 ENCOUNTER — Ambulatory Visit: Payer: 59 | Admitting: Internal Medicine

## 2023-07-28 ENCOUNTER — Other Ambulatory Visit: Payer: Self-pay | Admitting: Pharmacist

## 2023-07-28 VITALS — BP 136/110 | HR 72 | Temp 98.5°F | Ht 65.0 in | Wt 200.2 lb

## 2023-07-28 DIAGNOSIS — E1169 Type 2 diabetes mellitus with other specified complication: Secondary | ICD-10-CM

## 2023-07-28 DIAGNOSIS — E785 Hyperlipidemia, unspecified: Secondary | ICD-10-CM | POA: Diagnosis not present

## 2023-07-28 DIAGNOSIS — I129 Hypertensive chronic kidney disease with stage 1 through stage 4 chronic kidney disease, or unspecified chronic kidney disease: Secondary | ICD-10-CM

## 2023-07-28 DIAGNOSIS — E66811 Obesity, class 1: Secondary | ICD-10-CM

## 2023-07-28 DIAGNOSIS — E6609 Other obesity due to excess calories: Secondary | ICD-10-CM

## 2023-07-28 DIAGNOSIS — E538 Deficiency of other specified B group vitamins: Secondary | ICD-10-CM

## 2023-07-28 DIAGNOSIS — N182 Chronic kidney disease, stage 2 (mild): Secondary | ICD-10-CM

## 2023-07-28 DIAGNOSIS — Z6833 Body mass index (BMI) 33.0-33.9, adult: Secondary | ICD-10-CM

## 2023-07-28 MED ORDER — TIRZEPATIDE 5 MG/0.5ML ~~LOC~~ SOAJ: 5.0000 mg | SUBCUTANEOUS | 2 refills | Status: DC

## 2023-07-28 MED ORDER — CYANOCOBALAMIN 1000 MCG/ML IJ SOLN
1000.0000 ug | Freq: Once | INTRAMUSCULAR | Status: AC
Start: 2023-07-28 — End: ?

## 2023-07-28 NOTE — Progress Notes (Signed)
I,Victoria T Deloria Lair, CMA,acting as a Neurosurgeon for Gwynneth Aliment, MD.,have documented all relevant documentation on the behalf of Gwynneth Aliment, MD,as directed by  Gwynneth Aliment, MD while in the presence of Gwynneth Aliment, MD.  Subjective:  Patient ID: Shannon Mendoza , female    DOB: 18-Jan-1956 , 67 y.o.   MRN: [id number]  Chief Complaint  Patient presents with   Diabetes   Hypertension    HPI  Patient presents today for a f/u on her bp & diabetes.  She reports compliance with meds. She denies headaches, chest pain and shortness of breath. She has no new concerns at this time.   She reports mammogram & DM eye exam scheduled for 09/23/23.     Hypertension This is a chronic problem. The current episode started more than 1 year ago. The problem has been gradually improving since onset. The problem is uncontrolled. Pertinent negatives include no blurred vision. Risk factors for coronary artery disease include sedentary lifestyle and post-menopausal state. Past treatments include angiotensin blockers, diuretics and calcium channel blockers. The current treatment provides moderate improvement.     Past Medical History:  Diagnosis Date   HTN (hypertension)    Obesity      Family History  Problem Relation Age of Onset   Hypertension Mother    Diabetes Mother    Cancer Mother    Stroke Father    Cancer Sister      Current Outpatient Medications:    atorvastatin (LIPITOR) 20 MG tablet, TAKE ONE TABLET BY MOUTH MONDAY - SATURDAY. SKIP SUNDAYS., Disp: 90 tablet, Rfl: 1   Cholecalciferol (VITAMIN D-3) 125 MCG (5000 UT) TABS, Take 1 tablet by mouth daily., Disp: , Rfl:    metFORMIN (GLUCOPHAGE-XR) 500 MG 24 hr tablet, TAKE ONE TABLET BY MOUTH DAILY WITH EVENING MEAL, Disp: 90 tablet, Rfl: 1   Multiple Vitamins-Minerals (CENTRUM SILVER 50+WOMEN PO), Take by mouth., Disp: , Rfl:    spironolactone (ALDACTONE) 25 MG tablet, Take 0.5 tablets (12.5 mg total) by mouth daily. Start after  10/7 labs., Disp: 45 tablet, Rfl: 3   telmisartan-hydrochlorothiazide (MICARDIS HCT) 80-25 MG tablet, TAKE 1 TABLET BY MOUTH EVERY DAY, Disp: 90 tablet, Rfl: 2   tirzepatide (MOUNJARO) 5 MG/0.5ML Pen, Inject 5 mg into the skin once a week., Disp: 2 mL, Rfl: 2  Current Facility-Administered Medications:    cyanocobalamin ((VITAMIN B-12)) injection 1,000 mcg, 1,000 mcg, Intramuscular, Once, Dorothyann Peng, MD   cyanocobalamin (VITAMIN B12) injection 1,000 mcg, 1,000 mcg, Intramuscular, Once,    Allergies  Allergen Reactions   Codeine Nausea And Vomiting     Review of Systems  Constitutional: Negative.   Eyes:  Negative for blurred vision.  Respiratory: Negative.    Cardiovascular: Negative.   Gastrointestinal: Negative.   Skin: Negative.   Neurological: Negative.   Psychiatric/Behavioral: Negative.       Today's Vitals   07/28/23 1135 07/28/23 1157  BP: (!) 140/110 (!) 136/110  Pulse: 72   Temp: 98.5 F (36.9 C)   SpO2: 98%   Weight: 200 lb 3.2 oz (90.8 kg)   Height: 5\' 5"  (1.651 m)    Body mass index is 33.32 kg/m.  Wt Readings from Last 3 Encounters:  07/28/23 200 lb 3.2 oz (90.8 kg)  04/30/23 206 lb 6.4 oz (93.6 kg)  03/24/23 209 lb 9.6 oz (95.1 kg)     Objective:  Physical Exam Vitals and nursing note reviewed.  Constitutional:      Appearance:  Normal appearance. She is obese.  HENT:     Head: Normocephalic and atraumatic.  Eyes:     Extraocular Movements: Extraocular movements intact.  Cardiovascular:     Rate and Rhythm: Normal rate and regular rhythm.     Heart sounds: Normal heart sounds.  Pulmonary:     Effort: Pulmonary effort is normal.     Breath sounds: Normal breath sounds.  Musculoskeletal:     Cervical back: Normal range of motion.  Skin:    General: Skin is warm.  Neurological:     General: No focal deficit present.     Mental Status: She is alert.  Psychiatric:        Mood and Affect: Mood normal.        Behavior: Behavior normal.          Assessment And Plan:  Dyslipidemia associated with type 2 diabetes mellitus (HCC) Assessment & Plan: Chronic, LDL goal is less than 70.  She will continue with atorvastatin 20mg  daily, except Sundays and tirzepatide. I plan to continue to titrate tirzepatide as tolerated. Encouraged to aim for at least 150 minutes of exercise/week.   Orders: -     Lipid panel -     Hemoglobin A1c -     COMPLETE METABOLIC PANEL WITH GFR  Hypertensive nephropathy Assessment & Plan: Chronic, uncontrolled. Importance of medication/dietary compliance was discussed with the patient. She will continue with telmisartan/hct 80/25mg  daily. She was started on spironolactone at her last visit, no issues with meds. Encouraged to take meds at same time daily. I will have her rto in 3-4 weeks for NV/bp check. She is encouraged to take home readings as well.   Orders: -     COMPLETE METABOLIC PANEL WITH GFR  Chronic renal disease, stage II Assessment & Plan: Chronic. She is encouraged to stay well hydrated. I will check a GFR, Cr today.    Orders: -     COMPLETE METABOLIC PANEL WITH GFR  B12 deficiency -     Cyanocobalamin  Class 1 obesity due to excess calories with serious comorbidity and body mass index (BMI) of 33.0 to 33.9 in adult Assessment & Plan: She has lost 6lbs since July, encouraged to strive for BMI<30 to decrease cardiac risk.    Other orders -     Tirzepatide; Inject 5 mg into the skin once a week.  Dispense: 2 mL; Refill: 2  She is encouraged to strive for BMI less than 30 to decrease cardiac risk. Advised to aim for at least 150 minutes of exercise per week.    Return in 3 months (on 10/28/2023) for 3 month dm check.  Patient was given opportunity to ask questions. Patient verbalized understanding of the plan and was able to repeat key elements of the plan. All questions were answered to their satisfaction.    I, Gwynneth Aliment, MD, have reviewed all documentation for this visit.  The documentation on 07/28/23 for the exam, diagnosis, procedures, and orders are all accurate and complete.   IF YOU HAVE BEEN REFERRED TO A SPECIALIST, IT MAY TAKE 1-2 WEEKS TO SCHEDULE/PROCESS THE REFERRAL. IF YOU HAVE NOT HEARD FROM US/SPECIALIST IN TWO WEEKS, PLEASE GIVE [place] A CALL AT [phone].   THE PATIENT IS ENCOURAGED TO PRACTICE SOCIAL DISTANCING DUE TO THE COVID-19 PANDEMIC.

## 2023-07-28 NOTE — Patient Instructions (Signed)

## 2023-07-28 NOTE — Progress Notes (Signed)
Care Coordination Call  Received request from provider to follow up on script for [facility]. Patient noted in her visit today that no refills were on file, so a refill was sent today. Wondered if PA was needed.   Contacted [facility]. They note that the prescription did go through the insurance for a copay of $100. Contacted patient to notify. She notes she does have a Mounjaro savings card to use in combination.   [name], PharmD, BCACP, CPP Clinical Pharmacist [facility] [phone]

## 2023-07-29 LAB — LIPID PANEL
Cholesterol: 158 mg/dL (ref <200)
HDL: 53 mg/dL (ref >=50)
LDL Cholesterol (Calc): 86 mg/dL
Non-HDL Cholesterol (Calc): 105 mg/dL (ref <130)
Total CHOL/HDL Ratio: 3 (calc) (ref <5.0)
Triglycerides: 96 mg/dL (ref <150)

## 2023-07-29 LAB — HEMOGLOBIN A1C
Hgb A1c MFr Bld: 6.4 %{Hb} — ABNORMAL HIGH (ref <5.7)
Mean Plasma Glucose: 137 mg/dL
eAG (mmol/L): 7.6 mmol/L

## 2023-07-29 LAB — COMPLETE METABOLIC PANEL WITH GFR
AG Ratio: 1.8 (calc) (ref 1.0–2.5)
ALT: 16 U/L (ref 6–29)
AST: 15 U/L (ref 10–35)
Albumin: 4.7 g/dL (ref 3.6–5.1)
Alkaline phosphatase (APISO): 72 U/L (ref 37–153)
BUN: 13 mg/dL (ref 7–25)
CO2: 31 mmol/L (ref 20–32)
Calcium: 10.7 mg/dL — ABNORMAL HIGH (ref 8.6–10.4)
Chloride: 102 mmol/L (ref 98–110)
Creat: 0.98 mg/dL (ref 0.50–1.05)
Globulin: 2.6 g/dL (ref 1.9–3.7)
Glucose, Bld: 105 mg/dL — ABNORMAL HIGH (ref 65–99)
Potassium: 3.7 mmol/L (ref 3.5–5.3)
Sodium: 140 mmol/L (ref 135–146)
Total Bilirubin: 0.5 mg/dL (ref 0.2–1.2)
Total Protein: 7.3 g/dL (ref 6.1–8.1)
eGFR: 64 mL/min/{1.73_m2} (ref >=60)

## 2023-08-05 NOTE — Assessment & Plan Note (Signed)
Chronic. She is encouraged to stay well hydrated. I will check a GFR, Cr today.

## 2023-08-05 NOTE — Assessment & Plan Note (Signed)
Chronic, LDL goal is less than 70.  She will continue with atorvastatin 20mg  daily, except Sundays and tirzepatide. I plan to continue to titrate tirzepatide as tolerated. Encouraged to aim for at least 150 minutes of exercise/week.

## 2023-08-05 NOTE — Assessment & Plan Note (Addendum)
Chronic, uncontrolled. Importance of medication/dietary compliance was discussed with the patient. She will continue with telmisartan/hct 80/25mg  daily. She was started on spironolactone at her last visit, no issues with meds. Encouraged to take meds at same time daily. I will have her rto in 3-4 weeks for NV/bp check. She is encouraged to take home readings as well.

## 2023-08-05 NOTE — Assessment & Plan Note (Signed)
She has lost 6lbs since July, encouraged to strive for BMI<30 to decrease cardiac risk.

## 2023-08-25 ENCOUNTER — Other Ambulatory Visit: Payer: 59 | Admitting: Pharmacist

## 2023-08-25 MED ORDER — TIRZEPATIDE 2.5 MG/0.5ML ~~LOC~~ SOAJ: 2.5000 mg | SUBCUTANEOUS | 2 refills | Status: DC

## 2023-08-25 NOTE — Patient Instructions (Addendum)
Hi Mrs. Tiffney,   It was a pleasure talking with you today!  Just to recap:  - Restart Mounjaro at 2.5 mg weekly for 4 weeks to avoid GI upset when you're ready. Restart 5 mg weekly after 4 weeks of 2.5 mg weekly. Continue metformin 500 mg daily -  Begin atorvastatin 20 mg every day instead of Monday through Saturday  - Continue current blood pressure medications  Have a great day!   [name], PharmD, BCACP, CPP Clinical Pharmacist [facility] [phone]

## 2023-08-25 NOTE — Progress Notes (Signed)
08/25/2023 Name: Shannon Mendoza MRN: [id number] DOB: [birth year]  Chief Complaint  Patient presents with   Medication Management   Diabetes   Hypertension    Shannon Mendoza is a 67 y.o. year old female who presented for a telephone visit.   They were referred to the pharmacist by their PCP for assistance in managing diabetes and hypertension.    Subjective:  Care Team: Primary Care Provider: Dorothyann Peng, MD ; Next Scheduled Visit: 10/27/2023  Medication Access/Adherence  Current Pharmacy:  [id number] [place], [place] - [address]  [address] Phone: [phone] Fax: [phone]   Diabetes:  Current medications: metformin 500 mg 24 hr every day, Mounjaro 5 mg once weekly - Patient has been off of Mounjaro for about 2 weeks due to not feeling well. Had tooth pulled and been on penicillin and ibuprofen and hasn't resumed Mounjaro. Has continued metformin daily.   Current glucose readings: reports having high numbers  Patient Assistance: Mounjaro savings card    Hypertension:  Current medications: telmisartan-hydrochlorothiazide 80-25 mg daily, spironolactone 12.5 mg daily   Patient has a validated, automated, upper arm home BP cuff Current blood pressure readings readings: 143/83 mmHg    Hyperlipidemia/ASCVD Risk Reduction  Current lipid lowering medications: atorvastatin 20 mg daily Monday-Saturday  ASCVD History: none Family History: diabetes (mother), stroke (father), HTN (mother)  Risk Factors: HTN, HLD, DM  The 10-year ASCVD risk score (Arnett DK, et al., 2019) is: 20.3%   Values used to calculate the score:     Age: 20 years     Sex: Female     Is Non-Hispanic African American: Yes     Diabetic: Yes     Tobacco smoker: No     Systolic Blood Pressure: 136 mmHg     Is BP treated: Yes     HDL Cholesterol: 53 mg/dL     Total Cholesterol: 158 mg/dL    Objective:  Lab Results  Component Value Date    HGBA1C 6.4 (H) 07/28/2023    Lab Results  Component Value Date   CREATININE 0.98 07/28/2023   BUN 13 07/28/2023   NA 140 07/28/2023   K 3.7 07/28/2023   CL 102 07/28/2023   CO2 31 07/28/2023    Lab Results  Component Value Date   CHOL 158 07/28/2023   HDL 53 07/28/2023   LDLCALC 86 07/28/2023   TRIG 96 07/28/2023   CHOLHDL 3.0 07/28/2023    Medications Reviewed Today     Reviewed by Shannon Mendoza, RPH-CPP (Pharmacist) on 08/25/23 at 1044  Med List Status: <None>   Medication Order Taking? Sig Documenting Provider Last Dose Status Informant  atorvastatin (LIPITOR) 20 MG tablet [id number] Yes TAKE ONE TABLET BY MOUTH MONDAY - SATURDAY. SKIP SUNDAYS. Shannon Peng, MD Taking Active   Cholecalciferol (VITAMIN D-3) 125 MCG (5000 UT) TABS [id number] Yes Take 1 tablet by mouth daily. [provider] Taking Active   cyanocobalamin ((VITAMIN B-12)) injection 1,000 mcg [id number]   Shannon Peng, MD  Active   cyanocobalamin (VITAMIN B12) injection 1,000 mcg [id number]   Shannon Peng, MD  Active   metFORMIN (GLUCOPHAGE-XR) 500 MG 24 hr tablet [id number] Yes TAKE ONE TABLET BY MOUTH DAILY WITH EVENING MEAL Shannon Peng, MD Taking Active   Multiple Vitamins-Minerals (CENTRUM SILVER 50+WOMEN PO) [id number] Yes Take by mouth. [provider] Taking Active   spironolactone (ALDACTONE) 25 MG tablet [id number] Yes Take 0.5 tablets (12.5 mg total) by mouth daily. Start  after 10/7 labs. Shannon Peng, MD Taking Active   telmisartan-hydrochlorothiazide (MICARDIS HCT) 80-25 MG tablet [id number] Yes TAKE 1 TABLET BY MOUTH EVERY DAY Shannon Peng, MD Taking Active   tirzepatide [facility]) 5 MG/0.5ML Pen [id number] No Inject 5 mg into the skin once a week.  Patient not taking: Reported on 08/25/2023   Shannon Peng, MD Not Taking Active             Assessment/Plan:   Diabetes: - Currently controlled based on A1c of 6.4% 3 weeks ago (goal A1c <7%) - Reported having  high glucose readings (without specific numbers) most likely due to pain and pausing Mounjaro temporarily   - Reviewed long term cardiovascular and renal outcomes of uncontrolled blood sugar - Reviewed goal A1c, goal fasting, and goal 2 hour post prandial glucose - Recommend to restart Mounjaro at 2.5 mg weekly for 4 weeks since patient has been off medication for a few weeks to avoid GI upset. Restart 5 mg weekly after 4 weeks of 2.5 mg weekly.  - Recommend to check glucose periodically   Hypertension: - Currently uncontrolled (goal <130/80 mmHg).  - Blood pressure recheck scheduled for 09/02/2023 at office - Recommend to continue current regimen - telmisartan-hydrochlorothiazide 80-25 mg daily and spironolactone 12.5 mg daily  - Plan to adjust hypertension regimen if needed following recheck  Hyperlipidemia/ASCVD Risk Reduction: - Currently uncontrolled. Last LDL 86 (goal LDL <70) 4 weeks ago - Reviewed long term complications of uncontrolled cholesterol - Patient tolerating statin well - Recommend to take atorvastatin 20 mg every day instated of Monday through Saturday   Follow Up Plan: 10/06/2023 with pharmacist via telephone   Shannon Mendoza, PharmD PGY1 Pharmacy Resident 08/25/2023 11:19 AM   I have reviewed the pharmacist's encounter and agree with their documentation.   Shannon Mendoza, PharmD, BCACP, CPP [facility] [phone]

## 2023-09-02 ENCOUNTER — Ambulatory Visit: Payer: 59

## 2023-09-02 VITALS — BP 122/86 | HR 92 | Temp 98.1°F | Ht 65.0 in | Wt 200.0 lb

## 2023-09-02 DIAGNOSIS — I129 Hypertensive chronic kidney disease with stage 1 through stage 4 chronic kidney disease, or unspecified chronic kidney disease: Secondary | ICD-10-CM

## 2023-09-02 NOTE — Progress Notes (Signed)
She presents today for bpc. She reports knowing her bp will be high today. She had a tooth extraction a week ago. Along with taking 800MG  Ibuprofen. For blood pressure she currently takes Spirnolactone 25MG  during the day between 4 & 5 PM & Telmisartan- hydrochlorothiazide 80-25 she reports taking between 11am-1pm. Denies headache, chest pain, sob, blurred vision. She admits drinking at least 4 cups of water a day. She admits not regularly exercising. Initial bp: 130/94. Bp taken again after 10 minutes:  BP Readings from Last 3 Encounters:  09/02/23 122/86  07/28/23 (!) 136/110  04/30/23 (!) 138/104  Per provider patient is to continue with current regimen. She will follow up in January. Patient aware. Appointment scheduled.

## 2023-09-11 ENCOUNTER — Other Ambulatory Visit: Payer: Self-pay | Admitting: Internal Medicine

## 2023-09-23 LAB — HM MAMMOGRAPHY

## 2023-10-01 ENCOUNTER — Other Ambulatory Visit: Payer: Self-pay | Admitting: Internal Medicine

## 2023-10-01 DIAGNOSIS — E785 Hyperlipidemia, unspecified: Secondary | ICD-10-CM

## 2023-10-06 ENCOUNTER — Other Ambulatory Visit: Payer: 59 | Admitting: Pharmacist

## 2023-10-06 DIAGNOSIS — E1169 Type 2 diabetes mellitus with other specified complication: Secondary | ICD-10-CM

## 2023-10-06 DIAGNOSIS — E785 Hyperlipidemia, unspecified: Secondary | ICD-10-CM

## 2023-10-06 DIAGNOSIS — I10 Essential (primary) hypertension: Secondary | ICD-10-CM

## 2023-10-06 DIAGNOSIS — I129 Hypertensive chronic kidney disease with stage 1 through stage 4 chronic kidney disease, or unspecified chronic kidney disease: Secondary | ICD-10-CM

## 2023-10-06 MED ORDER — SPIRONOLACTONE 25 MG PO TABS: 25.0000 mg | ORAL_TABLET | Freq: Every day | ORAL | 3 refills | Status: DC

## 2023-10-06 MED ORDER — ATORVASTATIN CALCIUM 20 MG PO TABS: 20.0000 mg | ORAL_TABLET | Freq: Every day | ORAL | 3 refills | Status: DC

## 2023-10-06 NOTE — Patient Instructions (Signed)
Shannon Mendoza,   It was great talking with you today!  Increase spironolactone to 1 tablet daily. Check your blood pressure periodically, and any time you have concerning symptoms like headache, chest pain, dizziness, shortness of breath, or vision changes.   Our goal is less than 130/80.  To appropriately check your blood pressure, make sure you do the following:  1) Avoid caffeine, exercise, or tobacco products for 30 minutes before checking. Empty your bladder. 2) Sit with your back supported in a flat-backed chair. Rest your arm on something flat (arm of the chair, table, etc). 3) Sit still with your feet flat on the floor, resting, for at least 5 minutes.  4) Check your blood pressure. Take 1-2 readings.  5) Write down these readings and bring with you to any provider appointments.  Bring your home blood pressure machine with you to a provider's office for accuracy comparison at least once a year.   Make sure you take your blood pressure medications before you come to any office visit, even if you were asked to fast for labs.   Please reach out with any questions or concerns!  Shannon Mendoza, PharmD, BCACP, CPP Clinical Pharmacist [facility] [phone]

## 2023-10-06 NOTE — Progress Notes (Signed)
10/06/2023 Name: KYRIANA WIRZ MRN: [id number] DOB: 1955-12-03  Chief Complaint  Patient presents with   Medication Management    Shannon Mendoza is a 67 y.o. year old female who presented for a telephone visit.   They were referred to the pharmacist by their PCP for assistance in managing diabetes.    Subjective:  Care Team: Primary Care Provider: Dorothyann Peng, MD ; Next Scheduled Visit: 10/29/22  Medication Access/Adherence  Current Pharmacy:  [id number] - [place], [place] - [address] [place] [address] Phone: [phone] Fax: [phone]   Patient reports affordability concerns with their medications: No  Patient reports access/transportation concerns to their pharmacy: No  Patient reports adherence concerns with their medications:  No     Diabetes:  Current medications: Mounjaro 2.5 mg weekly x 5 weeks; metformin XR 500 mg daily  Reports some mild nausea a few days after taking Mounjaro, but overall doing well. Notes clothes are fitting looser.   Patient denies hypoglycemic s/sx including dizziness, shakiness, sweating. Patient denies hyperglycemic symptoms including polyuria, polydipsia, polyphagia, nocturia, neuropathy, blurred vision.  Current meal patterns: works third shift  - Breakfast: skips;  - Lunch: (1-3 pm): air fry chicken, salad (3 x weekly); water - Supper: (1 am-3 am): air fried chicken; salad; on weekends will have breakfast food - omelette (vegetables); side of grits - avoids heavy food; water - Snacks: almonds;  - Drinks:  mostly water, occasional soda;   Physical activity: not walking right now due to weather, but is thinking about swimming while taking her grandson to swim lessons at the [facility]  Hypertension:  Current medications: telmisartan/hydrochlorothiazide 80/25, spironolactone 12.5 mg daily, amlodipine 10 mg daily  Reports it is difficult to split the spironolactone pill.   Patient has a  validated, automated, upper arm home BP cuff Current blood pressure readings readings: 120s/80s; never less than 120/80  Hyperlipidemia/ASCVD Risk Reduction  Current lipid lowering medications: atorvastatin 20 mg daily - denies any muscle aches or pains with this change  Clinical ASCVD: No  The 10-year ASCVD risk score (Arnett DK, et al., 2019) is: 16.1%   Values used to calculate the score:     Age: 74 years     Sex: Female     Is Non-Hispanic African American: Yes     Diabetic: Yes     Tobacco smoker: No     Systolic Blood Pressure: 122 mmHg     Is BP treated: Yes     HDL Cholesterol: 53 mg/dL     Total Cholesterol: 158 mg/dL    Objective:  Lab Results  Component Value Date   HGBA1C 6.4 (H) 07/28/2023    Lab Results  Component Value Date   CREATININE 0.98 07/28/2023   BUN 13 07/28/2023   NA 140 07/28/2023   K 3.7 07/28/2023   CL 102 07/28/2023   CO2 31 07/28/2023    Lab Results  Component Value Date   CHOL 158 07/28/2023   HDL 53 07/28/2023   LDLCALC 86 07/28/2023   TRIG 96 07/28/2023   CHOLHDL 3.0 07/28/2023    Medications Reviewed Today     Reviewed by Alden Hipp, RPH-CPP (Pharmacist) on 10/06/23 at 1150  Med List Status: <None>   Medication Order Taking? Sig Documenting Provider Last Dose Status Informant  atorvastatin (LIPITOR) 20 MG tablet [id number] Yes TAKE ONE TABLET BY MOUTH MONDAY - SATURDAY. SKIP SUNDAYS. Dorothyann Peng, MD Taking Active   Cholecalciferol (VITAMIN D-3) 125 MCG (  5000 UT) TABS [id number]  Take 1 tablet by mouth daily. [provider]  Active   cyanocobalamin ((VITAMIN B-12)) injection 1,000 mcg [id number]   Dorothyann Peng, MD  Active   cyanocobalamin (VITAMIN B12) injection 1,000 mcg [id number]   Dorothyann Peng, MD  Active   metFORMIN (GLUCOPHAGE-XR) 500 MG 24 hr tablet [id number] Yes TAKE ONE TABLET BY MOUTH DAILY WITH EVENING MEAL Dorothyann Peng, MD Taking Active   Multiple Vitamins-Minerals (CENTRUM SILVER 50+WOMEN  PO) [id number] Yes Take by mouth. [provider] Taking Active   spironolactone (ALDACTONE) 25 MG tablet [id number] Yes Take 0.5 tablets (12.5 mg total) by mouth daily. Start after 10/7 labs. Dorothyann Peng, MD Taking Active   telmisartan-hydrochlorothiazide (MICARDIS HCT) 80-25 MG tablet [id number] Yes TAKE 1 TABLET BY MOUTH EVERY DAY Dorothyann Peng, MD Taking Active   tirzepatide [facility]) 2.5 MG/0.5ML Pen [id number] Yes Inject 2.5 mg into the skin once a week. Dorothyann Peng, MD Taking Active    Patient not taking:   Discontinued 10/06/23 1150 (Completed Course)               Assessment/Plan:   Diabetes: - Currently controlled - Reviewed long term cardiovascular and renal outcomes of uncontrolled blood sugar - Reviewed goal A1c, goal fasting, and goal 2 hour post prandial glucose - Reviewed dietary modifications including: focus on lean proteins, fruits and vegetables, whole grains.  - Reviewed lifestyle modifications including: restart physical activity through swimming  - Recommend to continue current regimen. Considered increasing Mounjaro to 5 mg weekly, but patient elects to continue current dose at this time  Hypertension: - Currently controlled - Reviewed appropriate blood pressure monitoring technique and reviewed goal blood pressure. Recommended to check home blood pressure and heart rate periodically - For ease of regimen, patient interested in increasing spironolactone to a full tablet. Increase to 25 mg daily. Patient counseled to monitor BP, contact us if development of hypotensive symptoms or readings <100/60. She will get follow up BMP at upcoming PCP visit (she will be out of town between now and then).  - Recommend to continue telmisartan/hydrochlorothiazide and amlodipine at this time   Hyperlipidemia/ASCVD Risk Reduction: - Currently uncontrolled, goal LDL <70 - Recommend to continue daily atorvastatin. If next LDL not at goal <70, recommend increasing  atorvastatin to 40 mg daily   Follow Up Plan: PCP visit in ~ 3 weeks, PharmD ~ 4 weeks after that  [name], PharmD, BCACP, CPP Clinical Pharmacist [facility] [phone]

## 2023-10-27 ENCOUNTER — Ambulatory Visit: Payer: 59 | Admitting: Internal Medicine

## 2023-10-27 ENCOUNTER — Encounter: Payer: Self-pay | Admitting: Internal Medicine

## 2023-10-27 VITALS — BP 118/82 | HR 89 | Temp 97.9°F | Ht 65.0 in | Wt 198.8 lb

## 2023-10-27 DIAGNOSIS — E538 Deficiency of other specified B group vitamins: Secondary | ICD-10-CM

## 2023-10-27 DIAGNOSIS — E785 Hyperlipidemia, unspecified: Secondary | ICD-10-CM

## 2023-10-27 DIAGNOSIS — N182 Chronic kidney disease, stage 2 (mild): Secondary | ICD-10-CM

## 2023-10-27 DIAGNOSIS — I129 Hypertensive chronic kidney disease with stage 1 through stage 4 chronic kidney disease, or unspecified chronic kidney disease: Secondary | ICD-10-CM | POA: Diagnosis not present

## 2023-10-27 DIAGNOSIS — E1169 Type 2 diabetes mellitus with other specified complication: Secondary | ICD-10-CM | POA: Diagnosis not present

## 2023-10-27 DIAGNOSIS — E1122 Type 2 diabetes mellitus with diabetic chronic kidney disease: Secondary | ICD-10-CM

## 2023-10-27 DIAGNOSIS — Z6833 Body mass index (BMI) 33.0-33.9, adult: Secondary | ICD-10-CM

## 2023-10-27 DIAGNOSIS — I10 Essential (primary) hypertension: Secondary | ICD-10-CM

## 2023-10-27 DIAGNOSIS — E6609 Other obesity due to excess calories: Secondary | ICD-10-CM

## 2023-10-27 DIAGNOSIS — E66811 Obesity, class 1: Secondary | ICD-10-CM

## 2023-10-27 MED ORDER — CYANOCOBALAMIN 1000 MCG/ML IJ SOLN: 1000.0000 ug | Freq: Once | INTRAMUSCULAR | Status: DC

## 2023-10-27 MED ORDER — CYANOCOBALAMIN 1000 MCG/ML IJ SOLN
1000.0000 ug | Freq: Once | INTRAMUSCULAR | Status: AC
  Administered 2023-10-27: 1000 ug via INTRAMUSCULAR

## 2023-10-27 NOTE — Progress Notes (Signed)
 I,Shannon Mendoza, CMA,acting as a neurosurgeon for Shannon LOISE Slocumb, MD.,have documented all relevant documentation on the behalf of Shannon LOISE Slocumb, MD,as directed by  Shannon LOISE Slocumb, MD while in the presence of Shannon LOISE Slocumb, MD.  Subjective:  Patient ID: Shannon Mendoza , female    DOB: 1956-08-29 , 68 y.o.   MRN: [id number]  Chief Complaint  Patient presents with   Diabetes   Hypertension    HPI  Patient presents today for a f/u on her bp & diabetes.  She reports compliance with meds. She denies headaches, chest pain and shortness of breath. She has no new concerns at this time.   Letter sent to GYN for mammogram.   She reports having an upcoming appointment with [facility] to complete DM eye exam.       Hypertension This is a chronic problem. The current episode started more than 1 year ago. The problem has been gradually improving since onset. The problem is uncontrolled. Pertinent negatives include no blurred vision. Risk factors for coronary artery disease include sedentary lifestyle and post-menopausal state. Past treatments include angiotensin blockers, diuretics and calcium  channel blockers. The current treatment provides moderate improvement.  Diabetes She presents for her follow-up diabetic visit. She has type 2 diabetes mellitus. Her disease course has been stable. There are no hypoglycemic associated symptoms. There are no diabetic associated symptoms. Pertinent negatives for diabetes include no blurred vision. There are no hypoglycemic complications. Risk factors for coronary artery disease include diabetes mellitus, dyslipidemia, hypertension and post-menopausal. An ACE inhibitor/angiotensin II receptor blocker is being taken.     Past Medical History:  Diagnosis Date   HTN (hypertension)    Obesity      Family History  Problem Relation Age of Onset   Hypertension Mother    Diabetes Mother    Cancer Mother    Stroke Father    Cancer Sister      Current  Outpatient Medications:    atorvastatin  (LIPITOR) 20 MG tablet, Take 1 tablet (20 mg total) by mouth daily., Disp: 90 tablet, Rfl: 3   Cholecalciferol (VITAMIN D-3) 125 MCG (5000 UT) TABS, Take 1 tablet by mouth daily., Disp: , Rfl:    metFORMIN  (GLUCOPHAGE -XR) 500 MG 24 hr tablet, TAKE ONE TABLET BY MOUTH DAILY WITH EVENING MEAL, Disp: 90 tablet, Rfl: 1   Multiple Vitamins-Minerals (CENTRUM SILVER 50+WOMEN PO), Take by mouth., Disp: , Rfl:    spironolactone  (ALDACTONE ) 25 MG tablet, Take 1 tablet (25 mg total) by mouth daily., Disp: 90 tablet, Rfl: 3   telmisartan -hydrochlorothiazide (MICARDIS  HCT) 80-25 MG tablet, TAKE 1 TABLET BY MOUTH EVERY DAY, Disp: 90 tablet, Rfl: 2   tirzepatide  (MOUNJARO ) 2.5 MG/0.5ML Pen, Inject 2.5 mg into the skin once a week., Disp: 2 mL, Rfl: 2  Current Facility-Administered Medications:    cyanocobalamin  ((VITAMIN B-12)) injection 1,000 mcg, 1,000 mcg, Intramuscular, Once, Mendoza Catheryn, MD   cyanocobalamin  (VITAMIN B12) injection 1,000 mcg, 1,000 mcg, Intramuscular, Once,    Allergies  Allergen Reactions   Codeine Nausea And Vomiting     Review of Systems  Constitutional: Negative.   Eyes:  Negative for blurred vision.  Respiratory: Negative.    Cardiovascular: Negative.   Gastrointestinal: Negative.   Neurological: Negative.   Psychiatric/Behavioral: Negative.       Today's Vitals   10/27/23 1525  BP: 118/82  Pulse: 89  Temp: 97.9 F (36.6 C)  SpO2: 98%  Weight: 198 lb 12.8 oz (90.2 kg)  Height: 5' 5 (  1.651 m)   Body mass index is 33.08 kg/m.  Wt Readings from Last 3 Encounters:  10/27/23 198 lb 12.8 oz (90.2 kg)  09/02/23 200 lb (90.7 kg)  07/28/23 200 lb 3.2 oz (90.8 kg)    BP Readings from Last 3 Encounters:  10/27/23 118/82  09/02/23 122/86  07/28/23 (!) 136/110     Objective:  Physical Exam Vitals and nursing note reviewed.  Constitutional:      Appearance: Normal appearance. She is obese.  HENT:     Head:  Normocephalic and atraumatic.  Eyes:     Extraocular Movements: Extraocular movements intact.  Cardiovascular:     Rate and Rhythm: Normal rate and regular rhythm.     Heart sounds: Normal heart sounds.  Pulmonary:     Effort: Pulmonary effort is normal.     Breath sounds: Normal breath sounds.  Skin:    General: Skin is warm.  Neurological:     General: No focal deficit present.     Mental Status: She is alert.  Psychiatric:        Mood and Affect: Mood normal.        Behavior: Behavior normal.         Assessment And Plan:  Dyslipidemia associated with type 2 diabetes mellitus (HCC) Assessment & Plan: Chronic, LDL goal is less than 70.  She will continue with atorvastatin  20mg  daily, except Sundays and tirzepatide . I plan to continue to titrate tirzepatide  as tolerated. Encouraged to aim for at least 150 minutes of exercise/week.   Orders: -     Hemoglobin A1c -     BASIC METABOLIC PANEL WITH GFR  Parenchymal renal hypertension, stage 1 through stage 4 or unspecified chronic kidney disease Assessment & Plan: Chronic, controlled. She is encouraged to follow low sodium diet and to aim for at least 150 minutes of exercise per week. She will continue with telmisartan /hct 80/25mg  daily and spironolactone  25mg  daily.   Orders: -     BASIC METABOLIC PANEL WITH GFR  Chronic renal disease, stage II Assessment & Plan: Chronic. She is encouraged to stay well hydrated. I will check a GFR, Cr today.    Orders: -     BASIC METABOLIC PANEL WITH GFR  B12 deficiency -     Vitamin B12 -     Cyanocobalamin   Class 1 obesity due to excess calories with serious comorbidity and body mass index (BMI) of 33.0 to 33.9 in adult Assessment & Plan: She is encouraged to strive for BMI<30 to decrease cardiac risk. She is encouraged to aim for at least 150 minutes of exercise per week.    She is encouraged to strive for BMI less than 30 to decrease cardiac risk. Advised to aim for at least 150  minutes of exercise per week.    Return if symptoms worsen or fail to improve.  Patient was given opportunity to ask questions. Patient verbalized understanding of the plan and was able to repeat key elements of the plan. All questions were answered to their satisfaction.    I, Shannon LOISE Slocumb, MD, have reviewed all documentation for this visit. The documentation on 10/27/23 for the exam, diagnosis, procedures, and orders are all accurate and complete.   IF YOU HAVE BEEN REFERRED TO A SPECIALIST, IT MAY TAKE 1-2 WEEKS TO SCHEDULE/PROCESS THE REFERRAL. IF YOU HAVE NOT HEARD FROM US /SPECIALIST IN TWO WEEKS, PLEASE GIVE US  A CALL AT [phone].   THE PATIENT IS ENCOURAGED TO PRACTICE SOCIAL  DISTANCING DUE TO THE COVID-19 PANDEMIC.

## 2023-10-27 NOTE — Patient Instructions (Signed)

## 2023-11-02 ENCOUNTER — Encounter: Payer: Self-pay | Admitting: Internal Medicine

## 2023-11-02 NOTE — Assessment & Plan Note (Signed)
She is encouraged to strive for BMI<30 to decrease cardiac risk. She is encouraged to aim for at least 150 minutes of exercise per week.

## 2023-11-02 NOTE — Assessment & Plan Note (Signed)
Chronic, controlled. She is encouraged to follow low sodium diet and to aim for at least 150 minutes of exercise per week. She will continue with telmisartan/hct 80/25mg  daily and spironolactone 25mg  daily.

## 2023-11-02 NOTE — Assessment & Plan Note (Signed)
Chronic. She is encouraged to stay well hydrated. I will check a GFR, Cr today.

## 2023-11-02 NOTE — Assessment & Plan Note (Signed)
Chronic, LDL goal is less than 70.  She will continue with atorvastatin 20mg  daily, except Sundays and tirzepatide. I plan to continue to titrate tirzepatide as tolerated. Encouraged to aim for at least 150 minutes of exercise/week.

## 2023-12-01 ENCOUNTER — Other Ambulatory Visit: Payer: Self-pay

## 2023-12-02 ENCOUNTER — Other Ambulatory Visit: Payer: Self-pay | Admitting: Pharmacist

## 2023-12-02 ENCOUNTER — Telehealth: Payer: Self-pay | Admitting: Pharmacist

## 2023-12-02 NOTE — Progress Notes (Addendum)
   12/02/2023  Patient ID: Shannon Mendoza, female   DOB: 06-28-1956, 68 y.o.   MRN: [id number]  Attempted to contact patient for scheduled appointment for medication management.  Unfortunately, they did not answer the phone and did not have an answering machine for me to leave a message.  Patient was called twice today.  Note will be forwarded to Care guide for reschedule attempt.  Beecher Mcardle, PharmD, BCACP Clinical Pharmacist [phone]

## 2023-12-08 ENCOUNTER — Ambulatory Visit: Payer: 59 | Admitting: Internal Medicine

## 2023-12-08 NOTE — Progress Notes (Deleted)
 I,Shaleena Crusoe T Deloria Lair, CMA,acting as a Neurosurgeon for Gwynneth Aliment, MD.,have documented all relevant documentation on the behalf of Gwynneth Aliment, MD,as directed by  Gwynneth Aliment, MD while in the presence of Gwynneth Aliment, MD.  Subjective:  Patient ID: Shannon Mendoza , female    DOB: January 15, 1956 , 68 y.o.   MRN: [id number]  No chief complaint on file.   HPI  Patient presents today for red eye.      Past Medical History:  Diagnosis Date   HTN (hypertension)    Obesity      Family History  Problem Relation Age of Onset   Hypertension Mother    Diabetes Mother    Cancer Mother    Stroke Father    Cancer Sister      Current Outpatient Medications:    atorvastatin (LIPITOR) 20 MG tablet, Take 1 tablet (20 mg total) by mouth daily., Disp: 90 tablet, Rfl: 3   Cholecalciferol (VITAMIN D-3) 125 MCG (5000 UT) TABS, Take 1 tablet by mouth daily., Disp: , Rfl:    metFORMIN (GLUCOPHAGE-XR) 500 MG 24 hr tablet, TAKE ONE TABLET BY MOUTH DAILY WITH EVENING MEAL, Disp: 90 tablet, Rfl: 1   Multiple Vitamins-Minerals (CENTRUM SILVER 50+WOMEN PO), Take by mouth., Disp: , Rfl:    spironolactone (ALDACTONE) 25 MG tablet, Take 1 tablet (25 mg total) by mouth daily., Disp: 90 tablet, Rfl: 3   telmisartan-hydrochlorothiazide (MICARDIS HCT) 80-25 MG tablet, TAKE 1 TABLET BY MOUTH EVERY DAY, Disp: 90 tablet, Rfl: 2   tirzepatide (MOUNJARO) 2.5 MG/0.5ML Pen, Inject 2.5 mg into the skin once a week., Disp: 2 mL, Rfl: 2  Current Facility-Administered Medications:    cyanocobalamin ((VITAMIN B-12)) injection 1,000 mcg, 1,000 mcg, Intramuscular, Once, Dorothyann Peng, MD   cyanocobalamin (VITAMIN B12) injection 1,000 mcg, 1,000 mcg, Intramuscular, Once,    Allergies  Allergen Reactions   Codeine Nausea And Vomiting     Review of Systems  Constitutional: Negative.   Respiratory: Negative.    Cardiovascular: Negative.   Neurological: Negative.   Psychiatric/Behavioral: Negative.       There  were no vitals filed for this visit. There is no height or weight on file to calculate BMI.  Wt Readings from Last 3 Encounters:  10/27/23 198 lb 12.8 oz (90.2 kg)  09/02/23 200 lb (90.7 kg)  07/28/23 200 lb 3.2 oz (90.8 kg)     Objective:  Physical Exam      Assessment And Plan:  There are no diagnoses linked to this encounter.   No follow-ups on file.  Patient was given opportunity to ask questions. Patient verbalized understanding of the plan and was able to repeat key elements of the plan. All questions were answered to their satisfaction.  Gwynneth Aliment, MD  I, Gwynneth Aliment, MD, have reviewed all documentation for this visit. The documentation on 12/08/23 for the exam, diagnosis, procedures, and orders are all accurate and complete.   IF YOU HAVE BEEN REFERRED TO A SPECIALIST, IT MAY TAKE 1-2 WEEKS TO SCHEDULE/PROCESS THE REFERRAL. IF YOU HAVE NOT HEARD FROM US/SPECIALIST IN TWO WEEKS, PLEASE GIVE [place] A CALL AT [phone].   THE PATIENT IS ENCOURAGED TO PRACTICE SOCIAL DISTANCING DUE TO THE COVID-19 PANDEMIC.

## 2023-12-11 ENCOUNTER — Ambulatory Visit
Admission: EM | Admit: 2023-12-11 | Discharge: 2023-12-11 | Disposition: A | Discharge status: Home / Self Care | Payer: 59 | Attending: Family Medicine | Admitting: Family Medicine

## 2023-12-11 ENCOUNTER — Other Ambulatory Visit: Payer: Self-pay

## 2023-12-11 ENCOUNTER — Encounter: Payer: Self-pay | Admitting: *Deleted

## 2023-12-11 DIAGNOSIS — H1032 Unspecified acute conjunctivitis, left eye: Secondary | ICD-10-CM

## 2023-12-11 MED ORDER — POLYMYXIN B-TRIMETHOPRIM 10000-0.1 UNIT/ML-% OP SOLN
2.0000 [drp] | Freq: Three times a day (TID) | OPHTHALMIC | 0 refills | Status: AC
Start: 2023-12-11 — End: 2023-12-18

## 2023-12-11 NOTE — ED Provider Notes (Signed)
 [facility]    CSN: [id number] Arrival date & time: 12/11/23  0906      History   Chief Complaint Chief Complaint  Patient presents with   Eye Drainage    HPI Shannon Mendoza is a 68 y.o. female, type 2 diabetes, hypertension, CKD 2.  HPI Patient here for evaluation left eye redness, drainage, irritation. Reports that yesterday her left eye was slightly pink and irritated.  Upon awaking this morning her entire sclera is red and she has itchiness and irritation evolving the conjunctive of her eye.  Patient has type 2 diabetes and reports she is scheduled for her diabetic eye exam next month.  She denies any visual acuity changes.  He has some mild photosensitivity outside but not with regular lighting here in the room or in the building. Endorses that she had mostly crusting around her eye this morning and a scant amount of drainage although drainage is not persistent.  Unaware of any known sick contacts.  Past Medical History:  Diagnosis Date   HTN (hypertension)    Obesity     Patient Active Problem List   Diagnosis Date Noted   Dyslipidemia associated with type 2 diabetes mellitus (HCC) 05/13/2023   B12 deficiency 05/13/2023   Hypertensive nephropathy 03/06/2020   Chronic renal disease, stage II 03/06/2020   Tooth infection 10/06/2018   Tooth pain 10/06/2018   Essential hypertension, benign 10/06/2018   Class 1 obesity due to excess calories with serious comorbidity and body mass index (BMI) of 33.0 to 33.9 in adult 10/06/2018    Past Surgical History:  Procedure Laterality Date   BREAST BIOPSY Left over 10 years ago   benign    OB History   No obstetric history on file.      Home Medications    Prior to Admission medications   Medication Sig Start Date End Date Taking? Authorizing Provider  atorvastatin (LIPITOR) 20 MG tablet Take 1 tablet (20 mg total) by mouth daily. 10/06/23  Yes Dorothyann Peng, MD  Cholecalciferol (VITAMIN D-3) 125 MCG  (5000 UT) TABS Take 1 tablet by mouth daily.   Yes [provider]  metFORMIN (GLUCOPHAGE-XR) 500 MG 24 hr tablet TAKE ONE TABLET BY MOUTH DAILY WITH EVENING MEAL 04/18/23  Yes Dorothyann Peng, MD  Multiple Vitamins-Minerals (CENTRUM SILVER 50+WOMEN PO) Take by mouth.   Yes [provider]  spironolactone (ALDACTONE) 25 MG tablet Take 1 tablet (25 mg total) by mouth daily. 10/06/23  Yes Dorothyann Peng, MD  telmisartan-hydrochlorothiazide (MICARDIS HCT) 80-25 MG tablet TAKE 1 TABLET BY MOUTH EVERY DAY 09/15/23  Yes Dorothyann Peng, MD  tirzepatide [facility]) 2.5 MG/0.5ML Pen Inject 2.5 mg into the skin once a week. 08/25/23  Yes Dorothyann Peng, MD  trimethoprim-polymyxin b (POLYTRIM) ophthalmic solution Place 2 drops into the left eye in the morning, at noon, and at bedtime for 7 days. 12/11/23 12/18/23 Yes Bing Neighbors, NP    Family History Family History  Problem Relation Age of Onset   Hypertension Mother    Diabetes Mother    Cancer Mother    Stroke Father    Cancer Sister     Social History Social History   Tobacco Use   Smoking status: Never   Smokeless tobacco: Never  Vaping Use   Vaping status: Never Used  Substance Use Topics   Alcohol use: Not Currently   Drug use: Never     Allergies   Codeine   Review of Systems Review of  Systems Pertinent negatives listed in HPI   Physical Exam Triage Vital Signs ED Triage Vitals  Encounter Vitals Group     BP 12/11/23 0941 117/79     Systolic BP Percentile --      Diastolic BP Percentile --      Pulse Rate 12/11/23 0941 81     Resp 12/11/23 0941 20     Temp 12/11/23 0941 98.5 F (36.9 C)     Temp Source 12/11/23 0941 Oral     SpO2 12/11/23 0941 97 %     Weight --      Height --      Head Circumference --      Peak Flow --      Pain Score 12/11/23 0956 3     Pain Loc --      Pain Education --      Exclude from Growth Chart --    No data found.  Updated Vital Signs BP 117/79 (BP Location:  Right Arm)   Pulse 81   Temp 98.5 F (36.9 C) (Oral)   Resp 20   SpO2 97%   Visual Acuity Right Eye Distance: 20/25 Left Eye Distance: 20/25 Bilateral Distance: 20/20 (corrected with glasses)  Right Eye Near:   Left Eye Near:    Bilateral Near:     Physical Exam Vitals reviewed.  Constitutional:      Appearance: Normal appearance.  HENT:     Head: Normocephalic and atraumatic.  Eyes:     Extraocular Movements: Extraocular movements intact.     Pupils: Pupils are equal, round, and reactive to light.  Cardiovascular:     Rate and Rhythm: Normal rate and regular rhythm.  Pulmonary:     Effort: Pulmonary effort is normal.     Breath sounds: Normal breath sounds.  Musculoskeletal:     Cervical back: Normal range of motion and neck supple.  Neurological:     General: No focal deficit present.     Mental Status: She is alert.      UC Treatments / Results  Labs (all labs ordered are listed, but only abnormal results are displayed) Labs Reviewed - No data to display  EKG   Radiology No results found.  Procedures Procedures (including critical care time)  Medications Ordered in UC Medications - No data to display  Initial Impression / Assessment and Plan / UC Course  I have reviewed the triage vital signs and the nursing notes.  Pertinent labs & imaging results that were available during my care of the patient were reviewed by me and considered in my medical decision making (see chart for details).    Acute Bacterial Conjunctivitis of left eye Treatment Polytrim 2 drops into the left eye 3 times daily for 7 days.  Cautioned patient of the limitations of urgent care and diagnosing other eye conditions which causes redness of eye.  Patient cautioned if her symptoms are not improving within the next 72 hours to follow-up with [facility]. Final Clinical Impressions(s) / UC Diagnoses   Final diagnoses:  Acute bacterial conjunctivitis of left eye      Discharge Instructions      Treating you for conjunctivitis, just simply a bacterial eye infection.  Apply Romycin ointment to your eyes twice daily for 7 days.  Any point you develop any difficulty seeing or any severe burning or no improvement with medication prescribed follow-up with your eye care provider or the eye care provider listed.  ED Prescriptions     Medication Sig Dispense Auth. Provider   trimethoprim-polymyxin b (POLYTRIM) ophthalmic solution Place 2 drops into the left eye in the morning, at noon, and at bedtime for 7 days. 10 mL Bing Neighbors, NP      PDMP not reviewed this encounter.   Bing Neighbors, NP 12/11/23 1136

## 2023-12-11 NOTE — ED Triage Notes (Signed)
 Left eye pain last night, left eye red and draining today

## 2023-12-11 NOTE — Discharge Instructions (Signed)
 Treating you for conjunctivitis, just simply a bacterial eye infection.  Apply Romycin ointment to your eyes twice daily for 7 days.  Any point you develop any difficulty seeing or any severe burning or no improvement with medication prescribed follow-up with your eye care provider or the eye care provider listed.

## 2023-12-24 ENCOUNTER — Other Ambulatory Visit: Payer: Self-pay | Admitting: Pharmacist

## 2023-12-24 ENCOUNTER — Telehealth: Payer: Self-pay | Admitting: Pharmacist

## 2023-12-24 DIAGNOSIS — E1169 Type 2 diabetes mellitus with other specified complication: Secondary | ICD-10-CM

## 2023-12-24 NOTE — Progress Notes (Signed)
   12/24/2023  Patient ID: Shannon Mendoza, female   DOB: 1956-05-26, 68 y.o.   MRN: [id number]  Patient was called regarding medication disease state management. Unfortunately, she did not answer her phone.  HIPAA compliant message was left on her voicemail.  Plan Call patient back in 1 week. Today's call was the second unsuccessful phone call.  Beecher Mcardle, PharmD, BCACP Clinical Pharmacist [phone]

## 2023-12-24 NOTE — Progress Notes (Signed)
   12/24/2023  Patient ID: Shannon Mendoza, female   DOB: 1955/10/18, 68 y.o.   MRN: [id number]  Called patient for diabetes management.  She did not answer the phone. HIPAA compliant message left on her voicemail.  Plan: Call patient back in 1 week.   Beecher Mcardle, PharmD, BCACP Clinical Pharmacist [phone]

## 2024-01-09 ENCOUNTER — Ambulatory Visit (INDEPENDENT_AMBULATORY_CARE_PROVIDER_SITE_OTHER)

## 2024-01-09 VITALS — BP 120/80 | HR 85 | Temp 98.6°F | Ht 65.0 in | Wt 199.8 lb

## 2024-01-09 DIAGNOSIS — E538 Deficiency of other specified B group vitamins: Secondary | ICD-10-CM

## 2024-01-09 MED ORDER — CYANOCOBALAMIN 1000 MCG/ML IJ SOLN
1000.0000 ug | Freq: Once | INTRAMUSCULAR | Status: AC
Start: 2024-01-09 — End: 2024-01-09
  Administered 2024-01-09: 1000 ug via INTRAMUSCULAR

## 2024-01-09 NOTE — Progress Notes (Signed)
Patient presents today for a b12 shot.

## 2024-01-11 ENCOUNTER — Other Ambulatory Visit: Payer: Self-pay | Admitting: Internal Medicine

## 2024-01-16 ENCOUNTER — Ambulatory Visit (INDEPENDENT_AMBULATORY_CARE_PROVIDER_SITE_OTHER)

## 2024-01-16 VITALS — BP 120/70 | HR 85 | Temp 98.6°F | Ht 65.0 in | Wt 199.0 lb

## 2024-01-16 DIAGNOSIS — E538 Deficiency of other specified B group vitamins: Secondary | ICD-10-CM | POA: Diagnosis not present

## 2024-01-16 MED ORDER — CYANOCOBALAMIN 1000 MCG/ML IJ SOLN
1000.0000 ug | Freq: Once | INTRAMUSCULAR | Status: AC
  Administered 2024-01-16: 1000 ug via INTRAMUSCULAR

## 2024-01-16 NOTE — Progress Notes (Signed)
Patient presents today for a b12 shot.

## 2024-01-23 ENCOUNTER — Ambulatory Visit: Payer: Self-pay

## 2024-02-06 ENCOUNTER — Ambulatory Visit (INDEPENDENT_AMBULATORY_CARE_PROVIDER_SITE_OTHER): Payer: Self-pay

## 2024-02-06 VITALS — BP 120/80 | HR 78 | Temp 98.1°F | Ht 65.0 in | Wt 199.0 lb

## 2024-02-06 DIAGNOSIS — E538 Deficiency of other specified B group vitamins: Secondary | ICD-10-CM

## 2024-02-06 MED ORDER — CYANOCOBALAMIN 1000 MCG/ML IJ SOLN
1000.0000 ug | Freq: Once | INTRAMUSCULAR | Status: AC
  Administered 2024-02-06: 1000 ug via INTRAMUSCULAR

## 2024-02-06 NOTE — Progress Notes (Signed)
 Patient presents today for b12 vaccine. They deny symptoms of recent infection, fever, and chills. They received injection in left arm.

## 2024-02-13 ENCOUNTER — Ambulatory Visit (INDEPENDENT_AMBULATORY_CARE_PROVIDER_SITE_OTHER)

## 2024-02-13 VITALS — BP 132/80 | Temp 98.1°F | Ht 65.0 in | Wt 198.0 lb

## 2024-02-13 DIAGNOSIS — E538 Deficiency of other specified B group vitamins: Secondary | ICD-10-CM | POA: Diagnosis not present

## 2024-02-13 MED ORDER — CYANOCOBALAMIN 1000 MCG/ML IJ SOLN
1000.0000 ug | Freq: Once | INTRAMUSCULAR | Status: AC
  Administered 2024-02-13: 1000 ug via INTRAMUSCULAR

## 2024-02-13 NOTE — Progress Notes (Signed)
 Patient presents today for b12 injection. They deny symptoms of recent infection, fever, and chills. She received injection In right arm.

## 2024-02-20 ENCOUNTER — Ambulatory Visit

## 2024-02-20 ENCOUNTER — Encounter (HOSPITAL_COMMUNITY): Payer: Self-pay

## 2024-02-27 ENCOUNTER — Ambulatory Visit (INDEPENDENT_AMBULATORY_CARE_PROVIDER_SITE_OTHER): Payer: Self-pay

## 2024-02-27 VITALS — BP 130/80 | HR 98 | Temp 98.5°F | Ht 65.0 in | Wt 198.0 lb

## 2024-02-27 DIAGNOSIS — E538 Deficiency of other specified B group vitamins: Secondary | ICD-10-CM

## 2024-02-27 MED ORDER — CYANOCOBALAMIN 1000 MCG/ML IJ SOLN
1000.0000 ug | Freq: Once | INTRAMUSCULAR | Status: AC
  Administered 2024-02-27: 1000 ug via INTRAMUSCULAR

## 2024-02-27 NOTE — Progress Notes (Signed)
 Patient is in office today for a nurse visit for B12 Injection. Patient Injection was given in the  Left deltoid. Patient tolerated injection well.

## 2024-03-05 ENCOUNTER — Ambulatory Visit: Payer: Self-pay

## 2024-03-12 ENCOUNTER — Ambulatory Visit: Payer: Self-pay

## 2024-03-17 LAB — HM DIABETES EYE EXAM

## 2024-03-25 ENCOUNTER — Encounter: Payer: 59 | Admitting: Internal Medicine

## 2024-03-25 NOTE — Progress Notes (Deleted)
 Del Favia, CMA,acting as a scribe for Smiley Dung, MD.,have documented all relevant documentation on the behalf of Smiley Dung, MD,as directed by  Smiley Dung, MD while in the presence of Smiley Dung, MD.  Subjective:    Patient ID: Shannon Mendoza , female    DOB: [birth year] , 68 y.o.   MRN: [id number]  No chief complaint on file.   HPI  HPI   Past Medical History:  Diagnosis Date   HTN (hypertension)    Obesity      Family History  Problem Relation Age of Onset   Hypertension Mother    Diabetes Mother    Cancer Mother    Stroke Father    Cancer Sister      Current Outpatient Medications:    atorvastatin  (LIPITOR) 20 MG tablet, Take 1 tablet (20 mg total) by mouth daily., Disp: 90 tablet, Rfl: 3   Cholecalciferol (VITAMIN D-3) 125 MCG (5000 UT) TABS, Take 1 tablet by mouth daily., Disp: , Rfl:    metFORMIN  (GLUCOPHAGE -XR) 500 MG 24 hr tablet, TAKE 1 TABLET BY MOUTH EVERY DAY WITH EVENING MEAL, Disp: 90 tablet, Rfl: 1   Multiple Vitamins-Minerals (CENTRUM SILVER 50+WOMEN PO), Take by mouth., Disp: , Rfl:    spironolactone  (ALDACTONE ) 25 MG tablet, Take 1 tablet (25 mg total) by mouth daily., Disp: 90 tablet, Rfl: 3   telmisartan -hydrochlorothiazide (MICARDIS  HCT) 80-25 MG tablet, TAKE 1 TABLET BY MOUTH EVERY DAY, Disp: 90 tablet, Rfl: 2   tirzepatide  (MOUNJARO ) 2.5 MG/0.5ML Pen, Inject 2.5 mg into the skin once a week., Disp: 2 mL, Rfl: 2  Current Facility-Administered Medications:    cyanocobalamin  ((VITAMIN B-12)) injection 1,000 mcg, 1,000 mcg, Intramuscular, Once, Cleave Curling, MD   cyanocobalamin  (VITAMIN B12) injection 1,000 mcg, 1,000 mcg, Intramuscular, Once,    Allergies  Allergen Reactions   Codeine Nausea And Vomiting      The patient states she uses {contraceptive methods:5051} for birth control. No LMP recorded. Patient is postmenopausal.. {Dysmenorrhea-menorrhagia:21918}. Negative for: breast discharge, breast lump(s), breast pain  and breast self exam. Associated symptoms include abnormal vaginal bleeding. Pertinent negatives include abnormal bleeding (hematology), anxiety, decreased libido, depression, difficulty falling sleep, dyspareunia, history of infertility, nocturia, sexual dysfunction, sleep disturbances, urinary incontinence, urinary urgency, vaginal discharge and vaginal itching. Diet regular.The patient states her exercise level is    . The patient's tobacco use is:  Social History   Tobacco Use  Smoking Status Never  Smokeless Tobacco Never  . She has been exposed to passive smoke. The patient's alcohol use is:  Social History   Substance and Sexual Activity  Alcohol Use Not Currently  . Additional information: Last pap ***, next one scheduled for ***.    Review of Systems   There were no vitals filed for this visit. There is no height or weight on file to calculate BMI.  Wt Readings from Last 3 Encounters:  02/27/24 198 lb (89.8 kg)  02/13/24 198 lb (89.8 kg)  02/06/24 199 lb (90.3 kg)     Objective:  Physical Exam      Assessment And Plan:     Encounter for annual health examination  B12 deficiency  Dyslipidemia associated with type 2 diabetes mellitus (HCC)  Essential hypertension, benign     No follow-ups on file. Patient was given opportunity to ask questions. Patient verbalized understanding of the plan and was able to repeat key elements of the plan. All questions were answered to their satisfaction.  Smiley Dung, MD  I, Smiley Dung, MD, have reviewed all documentation for this visit. The documentation on 03/25/24 for the exam, diagnosis, procedures, and orders are all accurate and complete.

## 2024-04-18 ENCOUNTER — Other Ambulatory Visit: Payer: Self-pay | Admitting: Internal Medicine

## 2024-06-25 ENCOUNTER — Ambulatory Visit
Admission: EM | Admit: 2024-06-25 | Discharge: 2024-06-25 | Disposition: A | Discharge status: Home / Self Care | Attending: Family Medicine | Admitting: Family Medicine

## 2024-06-25 ENCOUNTER — Encounter: Payer: Self-pay | Admitting: *Deleted

## 2024-06-25 ENCOUNTER — Other Ambulatory Visit: Payer: Self-pay

## 2024-06-25 DIAGNOSIS — M545 Low back pain, unspecified: Secondary | ICD-10-CM | POA: Diagnosis present

## 2024-06-25 DIAGNOSIS — R109 Unspecified abdominal pain: Secondary | ICD-10-CM

## 2024-06-25 LAB — POCT URINE DIPSTICK
Bilirubin, UA: NEGATIVE
Blood, UA: NEGATIVE
Glucose, UA: NEGATIVE mg/dL
Ketones, POC UA: NEGATIVE mg/dL
Nitrite, UA: NEGATIVE
Spec Grav, UA: 1.025 (ref 1.010–1.025)
Urobilinogen, UA: 1 U/dL
pH, UA: 7 (ref 5.0–8.0)

## 2024-06-25 MED ORDER — KETOROLAC TROMETHAMINE 30 MG/ML IJ SOLN
30.0000 mg | Freq: Once | INTRAMUSCULAR | Status: AC
  Administered 2024-06-25: 30 mg via INTRAMUSCULAR

## 2024-06-25 MED ORDER — TIZANIDINE HCL 4 MG PO TABS: 4.0000 mg | ORAL_TABLET | Freq: Four times a day (QID) | ORAL | 0 refills | Status: AC | PRN

## 2024-06-25 NOTE — ED Triage Notes (Signed)
 Pt reports right flank pain x 3 days. Denies fall/accident/injury. Pain worse with movements. She tried tylenol the first day. She states pain is worse today. Denies urinary Sx. She reports recent road trip to [place]  [place]. Denies SOB/difficulty breathing.

## 2024-06-25 NOTE — Discharge Instructions (Signed)
 You were seen today for right flank pain.  Your urine does not show evidence of kidney stone or overt infection.  I will send to the lab for more testing.  At this time your pain appears more muscular.  I have sent out a muscle relaxer, which may  make you tired/drowsy so please take when home and not driving. You otherwise can use motrin  600mg  every 6-8 hrs for pain. You may use a heating pad as well.  If you have worsening pain, or develop vomiting, fevers or chills then please go to the ER for further evaluation.

## 2024-06-25 NOTE — ED Provider Notes (Signed)
 [facility]    CSN: [id number] Arrival date & time: 06/25/24  1208      History   Chief Complaint Chief Complaint  Patient presents with   Flank Pain    HPI Shannon Mendoza is a 68 y.o. female.    Flank Pain Associated symptoms include abdominal pain.  Patient is here for right flank pain x 3 days.  No known injury.  No heavy lifting/twisting.  It has been coming and going, but today is worse.  Shooting through her side and into her stomach She was nauseated today.  No vomiting.  No fevers/chill.  No urinary symptoms noted.  No diarrhea or constipation.  She did take tylenol when it started with some help.   She did do a lot of walking in [place] at the end of August, and has been pain in the right leg since.  Does not think that is related to this.        Past Medical History:  Diagnosis Date   HTN (hypertension)    Obesity     Patient Active Problem List   Diagnosis Date Noted   Dyslipidemia associated with type 2 diabetes mellitus (HCC) 05/13/2023   B12 deficiency 05/13/2023   Hypertensive nephropathy 03/06/2020   Chronic renal disease, stage II 03/06/2020   Tooth infection 10/06/2018   Tooth pain 10/06/2018   Essential hypertension, benign 10/06/2018   Class 1 obesity due to excess calories with serious comorbidity and body mass index (BMI) of 33.0 to 33.9 in adult 10/06/2018    Past Surgical History:  Procedure Laterality Date   BREAST BIOPSY Left over 10 years ago   benign    OB History   No obstetric history on file.      Home Medications    Prior to Admission medications   Medication Sig Start Date End Date Taking? Authorizing Provider  atorvastatin  (LIPITOR) 20 MG tablet Take 1 tablet (20 mg total) by mouth daily. 10/06/23  Yes Jarold Medici, MD  Cholecalciferol (VITAMIN D-3) 125 MCG (5000 UT) TABS Take 1 tablet by mouth daily.   Yes [provider]  metFORMIN  (GLUCOPHAGE -XR) 500 MG 24 hr tablet TAKE 1 TABLET BY  MOUTH EVERY DAY WITH EVENING MEAL 01/12/24  Yes Jarold Medici, MD  Multiple Vitamins-Minerals (CENTRUM SILVER 50+WOMEN PO) Take by mouth.   Yes [provider]  spironolactone  (ALDACTONE ) 25 MG tablet Take 1 tablet (25 mg total) by mouth daily. 10/06/23  Yes Jarold Medici, MD  telmisartan -hydrochlorothiazide (MICARDIS  HCT) 80-25 MG tablet TAKE 1 TABLET BY MOUTH EVERY DAY 09/15/23  Yes Jarold Medici, MD  tirzepatide  (MOUNJARO ) 2.5 MG/0.5ML Pen INJECT 2.5 MG SUBCUTANEOUSLY WEEKLY 04/19/24  Yes Jarold Medici, MD    Family History Family History  Problem Relation Age of Onset   Hypertension Mother    Diabetes Mother    Cancer Mother    Stroke Father    Cancer Sister     Social History Social History   Tobacco Use   Smoking status: Never   Smokeless tobacco: Never  Vaping Use   Vaping status: Never Used  Substance Use Topics   Alcohol use: Not Currently   Drug use: Never     Allergies   Codeine   Review of Systems Review of Systems  Constitutional: Negative.   HENT: Negative.    Respiratory: Negative.    Cardiovascular: Negative.   Gastrointestinal:  Positive for abdominal pain.  Genitourinary:  Positive for flank pain.  Musculoskeletal:  Positive for  back pain.  Hematological: Negative.   Psychiatric/Behavioral: Negative.       Physical Exam Triage Vital Signs ED Triage Vitals  Encounter Vitals Group     BP 06/25/24 1239 (!) 166/99     Girls Systolic BP Percentile --      Girls Diastolic BP Percentile --      Boys Systolic BP Percentile --      Boys Diastolic BP Percentile --      Pulse Rate 06/25/24 1239 74     Resp 06/25/24 1239 18     Temp 06/25/24 1239 98 F (36.7 C)     Temp Source 06/25/24 1239 Oral     SpO2 06/25/24 1239 96 %     Weight --      Height --      Head Circumference --      Peak Flow --      Pain Score 06/25/24 1235 6     Pain Loc --      Pain Education --      Exclude from Growth Chart --    No data found.  Updated  Vital Signs BP (!) 166/99 (BP Location: Right Arm)   Pulse 74   Temp 98 F (36.7 C) (Oral)   Resp 18   SpO2 96%   Visual Acuity Right Eye Distance:   Left Eye Distance:   Bilateral Distance:    Right Eye Near:   Left Eye Near:    Bilateral Near:     Physical Exam Constitutional:      General: She is not in acute distress.    Appearance: Normal appearance. She is normal weight. She is not ill-appearing or toxic-appearing.  Cardiovascular:     Rate and Rhythm: Normal rate and regular rhythm.  Pulmonary:     Effort: Pulmonary effort is normal.     Breath sounds: Normal breath sounds.  Abdominal:     Palpations: Abdomen is soft.     Comments: Mildly tender across the upper abdomen  Musculoskeletal:     Cervical back: Normal range of motion and neck supple.     Comments: TTP to the right lower back to light palpation;  no lumbar spine or SI joint tenderness;  pain wraps to the abdomen with palpation;     Neurological:     General: No focal deficit present.     Mental Status: She is alert.  Psychiatric:        Mood and Affect: Mood normal.      UC Treatments / Results  Labs (all labs ordered are listed, but only abnormal results are displayed) Labs Reviewed  POCT URINE DIPSTICK - Abnormal; Notable for the following components:      Result Value   Color, UA light yellow (*)    Clarity, UA cloudy (*)    Leukocytes, UA Small (1+) (*)    All other components within normal limits  URINE CULTURE    EKG   Radiology No results found.  Procedures Procedures (including critical care time)  Medications Ordered in UC Medications  ketorolac  (TORADOL ) 30 MG/ML injection 30 mg (has no administration in time range)    Initial Impression / Assessment and Plan / UC Course  I have reviewed the triage vital signs and the nursing notes.  Pertinent labs & imaging results that were available during my care of the patient were reviewed by me and considered in my medical  decision making (see chart for details).   Final Clinical  Impressions(s) / UC Diagnoses   Final diagnoses:  Acute bilateral low back pain without sciatica  Right flank pain  Abdominal pain, unspecified abdominal location     Discharge Instructions      You were seen today for right flank pain.  Your urine does not show evidence of kidney stone or overt infection.  I will send to the lab for more testing.  At this time your pain appears more muscular.  I have sent out a muscle relaxer, which may  make you tired/drowsy so please take when home and not driving. You otherwise can use motrin  600mg  every 6-8 hrs for pain. You may use a heating pad as well.  If you have worsening pain, or develop vomiting, fevers or chills then please go to the ER for further evaluation.     ED Prescriptions     Medication Sig Dispense Auth. Provider   tiZANidine  (ZANAFLEX ) 4 MG tablet Take 1 tablet (4 mg total) by mouth every 6 (six) hours as needed for muscle spasms. 30 tablet Darral Longs, MD      PDMP not reviewed this encounter.   Darral Longs, MD 06/25/24 1314

## 2024-06-26 LAB — URINE CULTURE: Culture: >=100000 — AB

## 2024-06-28 ENCOUNTER — Ambulatory Visit: Payer: Self-pay

## 2024-07-22 ENCOUNTER — Other Ambulatory Visit: Payer: Self-pay | Admitting: Internal Medicine

## 2024-08-23 ENCOUNTER — Ambulatory Visit (INDEPENDENT_AMBULATORY_CARE_PROVIDER_SITE_OTHER): Payer: Self-pay | Admitting: Internal Medicine

## 2024-08-23 VITALS — BP 128/82 | HR 84 | Temp 98.3°F | Ht 65.0 in | Wt 206.8 lb

## 2024-08-23 DIAGNOSIS — E66811 Obesity, class 1: Secondary | ICD-10-CM | POA: Diagnosis not present

## 2024-08-23 DIAGNOSIS — Z6834 Body mass index (BMI) 34.0-34.9, adult: Secondary | ICD-10-CM | POA: Diagnosis not present

## 2024-08-23 DIAGNOSIS — N182 Chronic kidney disease, stage 2 (mild): Secondary | ICD-10-CM | POA: Diagnosis not present

## 2024-08-23 DIAGNOSIS — M25561 Pain in right knee: Secondary | ICD-10-CM | POA: Diagnosis not present

## 2024-08-23 DIAGNOSIS — Z0000 Encounter for general adult medical examination without abnormal findings: Secondary | ICD-10-CM | POA: Diagnosis not present

## 2024-08-23 DIAGNOSIS — E785 Hyperlipidemia, unspecified: Secondary | ICD-10-CM

## 2024-08-23 DIAGNOSIS — E1122 Type 2 diabetes mellitus with diabetic chronic kidney disease: Secondary | ICD-10-CM | POA: Diagnosis not present

## 2024-08-23 DIAGNOSIS — I129 Hypertensive chronic kidney disease with stage 1 through stage 4 chronic kidney disease, or unspecified chronic kidney disease: Secondary | ICD-10-CM | POA: Diagnosis not present

## 2024-08-23 DIAGNOSIS — E6609 Other obesity due to excess calories: Secondary | ICD-10-CM | POA: Diagnosis not present

## 2024-08-23 DIAGNOSIS — E1169 Type 2 diabetes mellitus with other specified complication: Secondary | ICD-10-CM

## 2024-08-23 LAB — POCT URINALYSIS DIPSTICK
Bilirubin, UA: NEGATIVE
Blood, UA: NEGATIVE
Glucose, UA: NEGATIVE
Ketones, UA: NEGATIVE
Leukocytes, UA: NEGATIVE
Nitrite, UA: NEGATIVE
Protein, UA: NEGATIVE
Spec Grav, UA: 1.02 (ref 1.010–1.025)
Urobilinogen, UA: 0.2 U/dL
pH, UA: 5.5 (ref 5.0–8.0)

## 2024-08-23 MED ORDER — SPIRONOLACTONE 25 MG PO TABS: 25.0000 mg | ORAL_TABLET | Freq: Every day | ORAL | 3 refills | Status: AC

## 2024-08-23 MED ORDER — TELMISARTAN-HCTZ 80-25 MG PO TABS: 1.0000 | ORAL_TABLET | Freq: Every day | ORAL | 2 refills | Status: AC

## 2024-08-23 MED ORDER — ATORVASTATIN CALCIUM 20 MG PO TABS: 20.0000 mg | ORAL_TABLET | Freq: Every day | ORAL | 3 refills | Status: AC

## 2024-08-23 MED ORDER — MOUNJARO 5 MG/0.5ML ~~LOC~~ SOAJ: 5.0000 mg | SUBCUTANEOUS | 1 refills | Status: AC

## 2024-08-23 NOTE — Assessment & Plan Note (Signed)
 Chronic, fair control. Goal BP <130/80.  EKG performed, NSR w/ nonspecific T abnormality.  Blood pressure well-controlled with spironolactone  and telmisartan  HCTZ. Advised against nighttime spironolactone  use. - Continue spironolactone  and telmisartan  HCTZ. - Advised against taking spironolactone  at night. - Follow low sodium diet.

## 2024-08-23 NOTE — Patient Instructions (Signed)

## 2024-08-23 NOTE — Assessment & Plan Note (Signed)
 Sharp pain exacerbated by walking. Tylenol provides some relief. Referral to orthopedics planned. - Referred to orthopedics for evaluation of right knee pain. - Recommended use of Voltaren gel for pain management.

## 2024-08-23 NOTE — Assessment & Plan Note (Addendum)
 A full exam was performed.  Importance of monthly self breast exams was discussed with the patient.  She is advised to get 30-45 minutes of regular exercise, no less than four to five days per week. Both weight-bearing and aerobic exercises are recommended.  She is advised to follow a healthy diet with at least six fruits/veggies per day, decrease intake of red meat and other saturated fats and to increase fish intake to twice weekly.  Meats/fish should not be fried -- baked, boiled or broiled is preferable. It is also important to cut back on your sugar intake.  Be sure to read labels - try to avoid anything with added sugar, high fructose corn syrup or other sweeteners.  If you must use a sweetener, you can try stevia or monkfruit.  It is also important to avoid artificially sweetened foods/beverages and diet drinks. Lastly, wear SPF 50 sunscreen on exposed skin and when in direct sunlight for an extended period of time.  Be sure to avoid fast food restaurants and aim for at least 60 ounces of water daily.    - Encouraged scheduling of mammogram. - Advised on regular follow-up appointments every 3-4 months.

## 2024-08-23 NOTE — Assessment & Plan Note (Addendum)
 Chronic, diabetic foot exam was performed.  Managed with metformin  and Mounjaro . Non-adherence to Mounjaro  due to lack of appetite suppression and nausea. Discussed medication adherence and follow-up.  LDL goal is less than 70.  She reports compliance with atorvastatin .  - Continue metformin . - Resume Mounjaro  at current dose, increase to 5 mg after two doses per patient request. - Advised on dietary modifications to manage nausea, including stopping eating three hours before bed and avoiding reflux-inducing foods. - Encouraged increased protein intake to prevent muscle mass loss. - Advised on hydration and bowel management to prevent constipation. -I DISCUSSED WITH THE PATIENT AT LENGTH REGARDING THE GOALS OF GLYCEMIC CONTROL AND POSSIBLE LONG-TERM COMPLICATIONS.  I  ALSO STRESSED THE IMPORTANCE OF COMPLIANCE WITH HOME GLUCOSE MONITORING, DIETARY RESTRICTIONS INCLUDING AVOIDANCE OF SUGARY DRINKS/PROCESSED FOODS,  ALONG WITH REGULAR EXERCISE.  I  ALSO STRESSED THE IMPORTANCE OF ANNUAL EYE EXAMS, SELF FOOT CARE AND COMPLIANCE WITH OFFICE VISITS.

## 2024-08-23 NOTE — Assessment & Plan Note (Signed)
 She is encouraged to strive for BMI less than 30 to decrease cardiac risk. Advised to aim for at least 150 minutes of exercise per week.

## 2024-08-23 NOTE — Assessment & Plan Note (Signed)
Chronic. She is encouraged to stay well hydrated. I will check a GFR, Cr today.

## 2024-08-23 NOTE — Progress Notes (Signed)
 I,Victoria T Emmitt, CMA,acting as a neurosurgeon for Shannon LOISE Slocumb, MD.,have documented all relevant documentation on the behalf of Shannon LOISE Slocumb, MD,as directed by  Shannon LOISE Slocumb, MD while in the presence of Shannon LOISE Slocumb, MD.  Subjective:    Patient ID: Shannon Mendoza , female    DOB: 1956/04/23 , 68 y.o.   MRN: [id number]  Chief Complaint  Patient presents with   Annual Exam    She is here today for a full physical exam. Patient reports compliance with her meds. She is followed by VALJEAN Jon Rummer for her pelvic exams. She denies headaches, chest pain and shortness of breath.  She complains of shooting pain in right leg. She also experiences this pain in her knees. She admits when she stops actively walking the pain grows worse.  Letter sent to GYN for mammogram result.    Diabetes   Hypertension    HPI Discussed the use of AI scribe software for clinical note transcription with the patient, who gave verbal consent to proceed.  History of Present Illness Shannon Mendoza is a 68 year old female with diabetes who presents for a physical and diabetes check.  She has been experiencing leg aches and sharp pain in her right knee for a couple of weeks. The knee pain is sharp, occurs when walking, and necessitates the use of a knee brace. The pain also persists when lying in bed. She takes Tylenol for relief.  She has a history of diabetes and is currently on metformin , atorvastatin , spironolactone , and telmisartan  HCTZ. She has not been taking Mounjaro  for the past three weeks due to lack of appetite suppression, although she still has the medication. She experiences nausea for a day to a day and a half after taking Mounjaro . She has not been checking her blood sugar regularly and missed her last scheduled appointment in June, which was rescheduled to the current visit.  Her physical activity has decreased due to knee pain, although she was previously walking every night as part of a  family walking challenge. During this period of increased activity, her leg ached significantly.  She eats a lot of fish and baked chicken, often using an air fryer, and drinks water regularly. She works night shifts and takes her blood pressure medications around lunchtime and before work. She reports she has not had heartburn or reflux in a long time.   Hypertension This is a chronic problem. The current episode started more than 1 year ago. The problem has been gradually improving since onset. The problem is uncontrolled. Pertinent negatives include no blurred vision. Risk factors for coronary artery disease include sedentary lifestyle and post-menopausal state. Past treatments include angiotensin blockers, diuretics and calcium  channel blockers. The current treatment provides moderate improvement.  Diabetes She presents for her follow-up diabetic visit. She has type 2 diabetes mellitus. Her disease course has been stable. There are no hypoglycemic associated symptoms. There are no diabetic associated symptoms. Pertinent negatives for diabetes include no blurred vision. There are no hypoglycemic complications. Risk factors for coronary artery disease include diabetes mellitus, dyslipidemia, hypertension and post-menopausal. An ACE inhibitor/angiotensin II receptor blocker is being taken.     Past Medical History:  Diagnosis Date   HTN (hypertension)    Obesity      Family History  Problem Relation Age of Onset   Hypertension Mother    Diabetes Mother    Cancer Mother    Stroke Father    Cancer Sister  Current Outpatient Medications:    Cholecalciferol (VITAMIN D-3) 125 MCG (5000 UT) TABS, Take 1 tablet by mouth daily., Disp: , Rfl:    metFORMIN  (GLUCOPHAGE -XR) 500 MG 24 hr tablet, TAKE 1 TABLET BY MOUTH EVERY DAY WITH EVENING MEAL, Disp: 90 tablet, Rfl: 1   Multiple Vitamins-Minerals (CENTRUM SILVER 50+WOMEN PO), Take by mouth., Disp: , Rfl:    tirzepatide  (MOUNJARO ) 5 MG/0.5ML  Pen, Inject 5 mg into the skin once a week., Disp: 2 mL, Rfl: 1   tiZANidine  (ZANAFLEX ) 4 MG tablet, Take 1 tablet (4 mg total) by mouth every 6 (six) hours as needed for muscle spasms., Disp: 30 tablet, Rfl: 0   atorvastatin  (LIPITOR) 20 MG tablet, Take 1 tablet (20 mg total) by mouth daily., Disp: 90 tablet, Rfl: 3   spironolactone  (ALDACTONE ) 25 MG tablet, Take 1 tablet (25 mg total) by mouth daily., Disp: 90 tablet, Rfl: 3   telmisartan -hydrochlorothiazide (MICARDIS  HCT) 80-25 MG tablet, Take 1 tablet by mouth daily., Disp: 90 tablet, Rfl: 2  Current Facility-Administered Medications:    cyanocobalamin  ((VITAMIN B-12)) injection 1,000 mcg, 1,000 mcg, Intramuscular, Once, Jarold Medici, MD   cyanocobalamin  (VITAMIN B12) injection 1,000 mcg, 1,000 mcg, Intramuscular, Once,    Allergies  Allergen Reactions   Codeine Nausea And Vomiting      The patient states she uses none for birth control. No LMP recorded. Patient is postmenopausal.. Negative for Dysmenorrhea. Negative for: breast discharge, breast lump(s), breast pain and breast self exam. Associated symptoms include abnormal vaginal bleeding. Pertinent negatives include abnormal bleeding (hematology), anxiety, decreased libido, depression, difficulty falling sleep, dyspareunia, history of infertility, nocturia, sexual dysfunction, sleep disturbances, urinary incontinence, urinary urgency, vaginal discharge and vaginal itching. Diet regular.The patient states her exercise level is    . The patient's tobacco use is:  Social History   Tobacco Use  Smoking Status Never  Smokeless Tobacco Never  . She has been exposed to passive smoke. The patient's alcohol use is:  Social History   Substance and Sexual Activity  Alcohol Use Not Currently   Review of Systems  Constitutional: Negative.   HENT: Negative.    Eyes: Negative.  Negative for blurred vision.  Respiratory: Negative.    Cardiovascular: Negative.   Gastrointestinal:  Negative.   Endocrine: Negative.   Genitourinary: Negative.   Musculoskeletal: Negative.   Skin: Negative.   Allergic/Immunologic: Negative.   Neurological: Negative.   Hematological: Negative.   Psychiatric/Behavioral: Negative.       Today's Vitals   08/23/24 0919  BP: 128/82  Pulse: 84  Temp: 98.3 F (36.8 C)  SpO2: 98%  Weight: 206 lb 12.8 oz (93.8 kg)  Height: 5' 5 (1.651 m)   Body mass index is 34.41 kg/m.  Wt Readings from Last 3 Encounters:  08/23/24 206 lb 12.8 oz (93.8 kg)  02/27/24 198 lb (89.8 kg)  02/13/24 198 lb (89.8 kg)     Objective:  Physical Exam Vitals and nursing note reviewed.  Constitutional:      Appearance: Normal appearance. She is obese.  HENT:     Head: Normocephalic and atraumatic.     Right Ear: Tympanic membrane, ear canal and external ear normal.     Left Ear: Tympanic membrane, ear canal and external ear normal.     Nose: Nose normal.     Mouth/Throat:     Mouth: Mucous membranes are moist.     Pharynx: Oropharynx is clear.  Eyes:     Extraocular Movements: Extraocular movements intact.  Conjunctiva/sclera: Conjunctivae normal.     Pupils: Pupils are equal, round, and reactive to light.  Neck:     Thyroid: Thyromegaly present.  Cardiovascular:     Rate and Rhythm: Normal rate and regular rhythm.     Pulses: Normal pulses.          Dorsalis pedis pulses are 2+ on the right side and 2+ on the left side.     Heart sounds: Normal heart sounds.  Pulmonary:     Effort: Pulmonary effort is normal.     Breath sounds: Normal breath sounds.  Chest:  Breasts:    Tanner Score is 5.     Right: Normal.     Left: Normal.  Abdominal:     General: Bowel sounds are normal.     Palpations: Abdomen is soft.     Comments: Rounded, soft.   Genitourinary:    Comments: deferred Musculoskeletal:        General: Tenderness present. Normal range of motion.     Cervical back: Normal range of motion and neck supple.  Feet:     Right foot:      Protective Sensation: 5 sites tested.  5 sites sensed.     Skin integrity: Skin integrity normal.     Toenail Condition: Right toenails are normal.     Left foot:     Protective Sensation: 5 sites tested.  5 sites sensed.     Skin integrity: Skin integrity normal.     Toenail Condition: Left toenails are normal.  Skin:    General: Skin is warm and dry.  Neurological:     General: No focal deficit present.     Mental Status: She is alert and oriented to person, place, and time.  Psychiatric:        Mood and Affect: Mood normal.        Behavior: Behavior normal.      Assessment And Plan:     Encounter for general adult medical examination w/o abnormal findings Assessment & Plan: A full exam was performed.  Importance of monthly self breast exams was discussed with the patient.  She is advised to get 30-45 minutes of regular exercise, no less than four to five days per week. Both weight-bearing and aerobic exercises are recommended.  She is advised to follow a healthy diet with at least six fruits/veggies per day, decrease intake of red meat and other saturated fats and to increase fish intake to twice weekly.  Meats/fish should not be fried -- baked, boiled or broiled is preferable. It is also important to cut back on your sugar intake.  Be sure to read labels - try to avoid anything with added sugar, high fructose corn syrup or other sweeteners.  If you must use a sweetener, you can try stevia or monkfruit.  It is also important to avoid artificially sweetened foods/beverages and diet drinks. Lastly, wear SPF 50 sunscreen on exposed skin and when in direct sunlight for an extended period of time.  Be sure to avoid fast food restaurants and aim for at least 60 ounces of water daily.    - Encouraged scheduling of mammogram. - Advised on regular follow-up appointments every 3-4 months.  Orders: -     CBC -     Lipid panel -     Comprehensive metabolic panel with GFR  Dyslipidemia  associated with type 2 diabetes mellitus (HCC) Assessment & Plan: Chronic, diabetic foot exam was performed.  Managed with metformin  and  Mounjaro . Non-adherence to Mounjaro  due to lack of appetite suppression and nausea. Discussed medication adherence and follow-up.  LDL goal is less than 70.  She reports compliance with atorvastatin .  - Continue metformin . - Resume Mounjaro  at current dose, increase to 5 mg after two doses per patient request. - Advised on dietary modifications to manage nausea, including stopping eating three hours before bed and avoiding reflux-inducing foods. - Encouraged increased protein intake to prevent muscle mass loss. - Advised on hydration and bowel management to prevent constipation. -I DISCUSSED WITH THE PATIENT AT LENGTH REGARDING THE GOALS OF GLYCEMIC CONTROL AND POSSIBLE LONG-TERM COMPLICATIONS.  I  ALSO STRESSED THE IMPORTANCE OF COMPLIANCE WITH HOME GLUCOSE MONITORING, DIETARY RESTRICTIONS INCLUDING AVOIDANCE OF SUGARY DRINKS/PROCESSED FOODS,  ALONG WITH REGULAR EXERCISE.  I  ALSO STRESSED THE IMPORTANCE OF ANNUAL EYE EXAMS, SELF FOOT CARE AND COMPLIANCE WITH OFFICE VISITS.   Orders: -     Hemoglobin A1c -     POCT urinalysis dipstick -     Microalbumin / creatinine urine ratio -     EKG 12-Lead -     Renal function panel -     Atorvastatin  Calcium ; Take 1 tablet (20 mg total) by mouth daily.  Dispense: 90 tablet; Refill: 3  Parenchymal renal hypertension, stage 1 through stage 4 or unspecified chronic kidney disease Assessment & Plan: Chronic, fair control. Goal BP <130/80.  EKG performed, NSR w/ nonspecific T abnormality.  Blood pressure well-controlled with spironolactone  and telmisartan  HCTZ. Advised against nighttime spironolactone  use. - Continue spironolactone  and telmisartan  HCTZ. - Advised against taking spironolactone  at night. - Follow low sodium diet.    Chronic renal disease, stage II Assessment & Plan: Chronic. She is encouraged to stay  well hydrated. I will check a GFR, Cr today.     Acute pain of right knee Assessment & Plan: Sharp pain exacerbated by walking. Tylenol provides some relief. Referral to orthopedics planned. - Referred to orthopedics for evaluation of right knee pain. - Recommended use of Voltaren gel for pain management.  Orders: -     Ambulatory referral to Orthopedic Surgery  Class 1 obesity due to excess calories with serious comorbidity and body mass index (BMI) of 34.0 to 34.9 in adult Assessment & Plan: She is encouraged to strive for BMI less than 30 to decrease cardiac risk. Advised to aim for at least 150 minutes of exercise per week.    Other orders -     Spironolactone ; Take 1 tablet (25 mg total) by mouth daily.  Dispense: 90 tablet; Refill: 3 -     Telmisartan -HCTZ; Take 1 tablet by mouth daily.  Dispense: 90 tablet; Refill: 2 -     Mounjaro ; Inject 5 mg into the skin once a week.  Dispense: 2 mL; Refill: 1    Return for 1 YEAR HM, 4 MONTH DM F/U. Patient was given opportunity to ask questions. Patient verbalized understanding of the plan and was able to repeat key elements of the plan. All questions were answered to their satisfaction.   I, Shannon LOISE Slocumb, MD, have reviewed all documentation for this visit. The documentation on 08/23/24 for the exam, diagnosis, procedures, and orders are all accurate and complete.

## 2024-08-24 LAB — LAB REPORT - SCANNED
A1c: 6.2
EGFR: 59

## 2024-08-25 ENCOUNTER — Ambulatory Visit: Payer: Self-pay | Admitting: Internal Medicine

## 2024-09-01 ENCOUNTER — Other Ambulatory Visit

## 2024-09-01 ENCOUNTER — Ambulatory Visit: Admitting: Orthopaedic Surgery

## 2024-09-01 DIAGNOSIS — M25561 Pain in right knee: Secondary | ICD-10-CM | POA: Diagnosis not present

## 2024-09-01 NOTE — Progress Notes (Unsigned)
 Office Visit Note   Patient: Shannon Mendoza           Date of Birth: 1956-08-15           MRN: [id number] Visit Date: 09/01/2024              Requested by: Shannon Medici, MD [address] [place],  [place] [address] PCP: Shannon Medici, MD   Assessment & Plan: Visit Diagnoses:  1. Acute pain of right knee     Plan: [name] is a 68 year old female with primarily right knee patellofemoral arthritis.  Long discussion was had about condition and treatment options.  Will back off on some activity and use OTC meds.   Follow-Up Instructions: No follow-ups on file.   Orders:  Orders Placed This Encounter  Procedures   XR KNEE 3 VIEW RIGHT   No orders of the defined types were placed in this encounter.     Procedures: No procedures performed   Clinical Data: No additional findings.   Subjective: Chief Complaint  Patient presents with   Right Knee - Pain    HPI  Patient is a 68 year old female who presents today for knee pain.  Was referred by her family medicine physician on 11/10 during a annual wellness visit when she complained about shooting pain in her right leg as well as bilateral knee pain.  Patient is new to our practice.  Pain is mainly around the patella accompanied by crepitus.  Review of Systems  Constitutional: Negative.   HENT: Negative.    Eyes: Negative.   Respiratory: Negative.    Cardiovascular: Negative.   Endocrine: Negative.   Musculoskeletal: Negative.   Neurological: Negative.   Hematological: Negative.   Psychiatric/Behavioral: Negative.    All other systems reviewed and are negative.    Objective: Vital Signs: There were no vitals taken for this visit.  Physical Exam Vitals and nursing note reviewed.  Constitutional:      Appearance: She is well-developed.  HENT:     Head: Atraumatic.     Nose: Nose normal.  Eyes:     Extraocular Movements: Extraocular movements intact.  Cardiovascular:     Pulses: Normal  pulses.  Pulmonary:     Effort: Pulmonary effort is normal.  Abdominal:     Palpations: Abdomen is soft.  Musculoskeletal:     Cervical back: Neck supple.  Skin:    General: Skin is warm.     Capillary Refill: Capillary refill takes less than 2 seconds.  Neurological:     Mental Status: She is alert. Mental status is at baseline.  Psychiatric:        Behavior: Behavior normal.        Thought Content: Thought content normal.        Judgment: Judgment normal.     Ortho Exam Right knee exam  - PF crepitus with ROM - no effusion. - normal ROM  Specialty Comments:  No specialty comments available.  Imaging: No results found.   PMFS History: Patient Active Problem List   Diagnosis Date Noted   Encounter for general adult medical examination w/o abnormal findings 08/23/2024   Acute pain of right knee 08/23/2024   Dyslipidemia associated with type 2 diabetes mellitus (HCC) 05/13/2023   B12 deficiency 05/13/2023   Parenchymal renal hypertension 03/06/2020   Chronic renal disease, stage II 03/06/2020   Tooth infection 10/06/2018   Tooth pain 10/06/2018   Essential hypertension, benign 10/06/2018   Class 1 obesity  due to excess calories with serious comorbidity and body mass index (BMI) of 34.0 to 34.9 in adult 10/06/2018   Past Medical History:  Diagnosis Date   HTN (hypertension)    Obesity     Family History  Problem Relation Age of Onset   Hypertension Mother    Diabetes Mother    Cancer Mother    Stroke Father    Cancer Sister     Past Surgical History:  Procedure Laterality Date   BREAST BIOPSY Left over 10 years ago   benign   Social History   Occupational History   Not on file  Tobacco Use   Smoking status: Never   Smokeless tobacco: Never  Vaping Use   Vaping status: Never Used  Substance and Sexual Activity   Alcohol use: Not Currently   Drug use: Never   Sexual activity: Not on file

## 2024-10-11 ENCOUNTER — Ambulatory Visit: Payer: Self-pay

## 2024-10-11 NOTE — Telephone Encounter (Signed)
 FYI Only or Action Required?: FYI only for provider: appointment scheduled on 12.30.25.  Patient was last seen in primary care on 08/23/2024 by Jarold Medici, MD.  Called Nurse Triage reporting Appointment.  Symptoms began several days ago.  Interventions attempted: Nothing.  Symptoms are: unchanged.  Triage Disposition: See PCP Within 2 Weeks  Patient/caregiver understands and will follow disposition?: Yes   Copied from CRM #[id number]. Topic: Clinical - Red Word Triage >> Oct 11, 2024  2:52 PM Wess RAMAN wrote: Red Word that prompted transfer to Nurse Triage: Nausea due to not having b12 shot for a couple days. Reason for Disposition  Nausea is a chronic symptom (recurrent or ongoing AND present > 4 weeks)  Answer Assessment - Initial Assessment Questions 1. NAUSEA SEVERITY: How bad is the nausea? (e.g., mild, moderate, severe; dehydration, weight loss)      Pt states nausea is not bad  2. ONSET: When did the nausea begin?      X a few days  3. VOMITING: Any vomiting? If Yes, ask: How many times today?      Denies  4. RECURRENT SYMPTOM: Have you had nausea before? If Yes, ask: When was the last time? What happened that time?      Yes, Recurrent  5. CAUSE: What do you think is causing the nausea?     Due for B12 injection   Pt reports nausea, not vomiting Pt scheduled for a visit on  12.30.25 for further evaluation. Pt agrees with plan of care, will call back for any worsening symptoms  Protocols used: Nausea-A-AH

## 2024-10-12 ENCOUNTER — Encounter: Admitting: Nurse Practitioner

## 2024-10-12 ENCOUNTER — Ambulatory Visit (INDEPENDENT_AMBULATORY_CARE_PROVIDER_SITE_OTHER)

## 2024-10-12 VITALS — BP 130/80 | HR 98 | Temp 99.0°F | Ht 65.0 in | Wt 197.6 lb

## 2024-10-12 DIAGNOSIS — E538 Deficiency of other specified B group vitamins: Secondary | ICD-10-CM

## 2024-10-12 MED ORDER — CYANOCOBALAMIN 1000 MCG/ML IJ SOLN
1000.0000 ug | Freq: Once | INTRAMUSCULAR | Status: AC
  Administered 2024-10-12: 1000 ug via INTRAMUSCULAR

## 2024-10-12 NOTE — Progress Notes (Deleted)
 LILLETTE Kristeen JINNY Gladis, CMA,acting as a neurosurgeon for Gaines Ada, FNP.,have documented all relevant documentation on the behalf of Gaines Ada, FNP,as directed by  Gaines Ada, FNP while in the presence of Gaines Ada, FNP.  Subjective:  Patient ID: Shannon Mendoza , female    DOB: November 20, 1955 , 68 y.o.   MRN: [id number]  No chief complaint on file.   HPI  HPI   Past Medical History:  Diagnosis Date   HTN (hypertension)    Obesity      Family History  Problem Relation Age of Onset   Hypertension Mother    Diabetes Mother    Cancer Mother    Stroke Father    Cancer Sister     Current Medications[1]   Allergies[2]   Review of Systems   There were no vitals filed for this visit. There is no height or weight on file to calculate BMI.  Wt Readings from Last 3 Encounters:  08/23/24 206 lb 12.8 oz (93.8 kg)  02/27/24 198 lb (89.8 kg)  02/13/24 198 lb (89.8 kg)    The 10-year ASCVD risk score (Arnett DK, et al., 2019) is: 18.8%   Values used to calculate the score:     Age: 89 years     Clinically relevant sex: Female     Is Non-Hispanic African American: Yes     Diabetic: Yes     Tobacco smoker: No     Systolic Blood Pressure: 128 mmHg     Is BP treated: Yes     HDL Cholesterol: 53 mg/dL     Total Cholesterol: 158 mg/dL  Objective:  Physical Exam      Assessment And Plan:   Assessment & Plan   No orders of the defined types were placed in this encounter.    No follow-ups on file.  Patient was given opportunity to ask questions. Patient verbalized understanding of the plan and was able to repeat key elements of the plan. All questions were answered to their satisfaction.    LILLETTE Gaines Ada, FNP, have reviewed all documentation for this visit. The documentation on 10/12/2024 for the exam, diagnosis, procedures, and orders are all accurate and complete.   IF YOU HAVE BEEN REFERRED TO A SPECIALIST, IT MAY TAKE 1-2 WEEKS TO SCHEDULE/PROCESS THE REFERRAL. IF YOU  HAVE NOT HEARD FROM US /SPECIALIST IN TWO WEEKS, PLEASE GIVE US  A CALL AT [phone].     [1]  Current Outpatient Medications:    atorvastatin  (LIPITOR) 20 MG tablet, Take 1 tablet (20 mg total) by mouth daily., Disp: 90 tablet, Rfl: 3   Cholecalciferol (VITAMIN D-3) 125 MCG (5000 UT) TABS, Take 1 tablet by mouth daily., Disp: , Rfl:    metFORMIN  (GLUCOPHAGE -XR) 500 MG 24 hr tablet, TAKE 1 TABLET BY MOUTH EVERY DAY WITH EVENING MEAL, Disp: 90 tablet, Rfl: 1   Multiple Vitamins-Minerals (CENTRUM SILVER 50+WOMEN PO), Take by mouth., Disp: , Rfl:    spironolactone  (ALDACTONE ) 25 MG tablet, Take 1 tablet (25 mg total) by mouth daily., Disp: 90 tablet, Rfl: 3   telmisartan -hydrochlorothiazide (MICARDIS  HCT) 80-25 MG tablet, Take 1 tablet by mouth daily., Disp: 90 tablet, Rfl: 2   tirzepatide  (MOUNJARO ) 5 MG/0.5ML Pen, Inject 5 mg into the skin once a week., Disp: 2 mL, Rfl: 1   tiZANidine  (ZANAFLEX ) 4 MG tablet, Take 1 tablet (4 mg total) by mouth every 6 (six) hours as needed for muscle spasms., Disp: 30 tablet, Rfl: 0  Current Facility-Administered Medications:  cyanocobalamin  ((VITAMIN B-12)) injection 1,000 mcg, 1,000 mcg, Intramuscular, Once, Jarold Medici, MD   cyanocobalamin  (VITAMIN B12) injection 1,000 mcg, 1,000 mcg, Intramuscular, Once,  [2]  Allergies Allergen Reactions   Codeine Nausea And Vomiting

## 2024-10-12 NOTE — Progress Notes (Signed)
 Patient is in office today for a nurse visit for B12 Injection. Patient Injection was given in the  Left deltoid. Patient tolerated injection well.

## 2024-10-24 NOTE — Progress Notes (Signed)
 She had a nurse visit for vitamin B12 injection.

## 2024-10-26 ENCOUNTER — Ambulatory Visit: Payer: Self-pay

## 2024-10-26 VITALS — BP 130/80 | HR 90 | Temp 98.3°F | Ht 65.0 in | Wt 197.0 lb

## 2024-10-26 DIAGNOSIS — E538 Deficiency of other specified B group vitamins: Secondary | ICD-10-CM | POA: Diagnosis not present

## 2024-10-26 MED ORDER — CYANOCOBALAMIN 1000 MCG/ML IJ SOLN
1000.0000 ug | Freq: Once | INTRAMUSCULAR | Status: AC
  Administered 2024-10-26: 1000 ug via INTRAMUSCULAR

## 2024-10-26 NOTE — Progress Notes (Signed)
 Patient is in office today for a nurse visit for B12 Injection. Patient Injection was given in the  Left deltoid. Patient tolerated injection well.

## 2024-10-26 NOTE — Patient Instructions (Signed)
 Vitamin B12 Deficiency Vitamin B12 deficiency means that your body does not have enough vitamin B12. The body needs this important vitamin: To make red blood cells. To make genes (DNA). To help the nerves work. If you do not have enough vitamin B12 in your body, you can have health problems, such as not having enough red blood cells in the blood (anemia). What are the causes? Not eating enough foods that contain vitamin B12. Not being able to take in (absorb) vitamin B12 from the food that you eat. Certain diseases. A condition in which the body does not make enough of a certain protein. This results in your body not taking in enough vitamin B12. Having a surgery in which part of the stomach or small intestine is taken out. Taking medicines that make it hard for the body to take in vitamin B12. These include: Heartburn medicines. Some medicines that are used to treat diabetes. What increases the risk? Being an older adult. Eating a vegetarian or vegan diet that does not include any foods that come from animals. Not eating enough foods that contain vitamin B12 while you are pregnant. Taking certain medicines. Having alcoholism. What are the signs or symptoms? In some cases, there are no symptoms. If the condition leads to too few blood cells or nerve damage, symptoms can occur, such as: Feeling weak or tired. Not being hungry. Losing feeling (numbness) or tingling in your hands and feet. Redness and burning of the tongue. Feeling sad (depressed). Confusion or memory problems. Trouble walking. If anemia is very bad, symptoms can include: Being short of breath. Being dizzy. Having a very fast heartbeat. How is this treated? Changing the way you eat and drink, such as: Eating more foods that contain vitamin B12. Drinking little or no alcohol. Getting vitamin B12 shots. Taking vitamin B12 supplements by mouth (orally). Your doctor will tell you the dose that is best for you. Follow  these instructions at home: Eating and drinking  Eat foods that come from animals and have a lot of vitamin B12 in them. These include: Meats and poultry. This includes beef, pork, chicken, [place], and organ meats, such as liver. Seafood, such as clams, rainbow trout, salmon, tuna, and haddock. Eggs. Dairy foods such as milk, yogurt, and cheese. Eat breakfast cereals that have vitamin B12 added to them (are fortified). Check the label. The items listed above may not be a complete list of foods and beverages you can eat and drink. Contact a dietitian for more information. Alcohol use Do not drink alcohol if: Your doctor tells you not to drink. You are pregnant, may be pregnant, or are planning to become pregnant. If you drink alcohol: Limit how much you have to: 0-1 drink a day for women. 0-2 drinks a day for men. Know how much alcohol is in your drink. In the [place] one drink equals one 12 oz bottle of beer (355 mL), one 5 oz glass of wine (148 mL), or one 1 oz glass of hard liquor (44 mL). General instructions Get any vitamin B12 shots if told by your doctor. Take supplements only as told by your doctor. Follow the directions. Keep all follow-up visits. Contact a doctor if: Your symptoms come back. Your symptoms get worse or do not get better with treatment. Get help right away if: You have trouble breathing. You have a very fast heartbeat. You have chest pain. You get dizzy. You faint. These symptoms may be an emergency. Get help right away. Call 911.  Do not wait to see if the symptoms will go away. Do not drive yourself to the hospital. Summary Vitamin B12 deficiency means that your body is not getting enough of the vitamin. In some cases, there are no symptoms of this condition. Treatment may include making a change in the way you eat and drink, getting shots, or taking supplements. Eat foods that have vitamin B12 in them. This information is not intended to replace advice  given to you by your health care provider. Make sure you discuss any questions you have with your health care provider. Document Revised: 05/25/2021 Document Reviewed: 05/25/2021 [employer]vier Patient Education  2024 [employer].

## 2024-11-09 ENCOUNTER — Ambulatory Visit: Payer: Self-pay

## 2024-11-16 ENCOUNTER — Ambulatory Visit

## 2024-11-16 VITALS — BP 140/80 | HR 76 | Temp 98.1°F | Ht 65.0 in | Wt 197.0 lb

## 2024-11-16 DIAGNOSIS — E538 Deficiency of other specified B group vitamins: Secondary | ICD-10-CM

## 2024-11-16 MED ORDER — CYANOCOBALAMIN 1000 MCG/ML IJ SOLN
1000.0000 ug | Freq: Once | INTRAMUSCULAR | Status: AC
  Administered 2024-11-16: 1000 ug via INTRAMUSCULAR

## 2024-11-16 NOTE — Progress Notes (Signed)
 Patient presents today for a vitamin B12 injection. Patient received injection in her left deltoid. Patient tolerated injection well. Patient is to return next week for her 4th vitamin b12 injection. YL,RMA

## 2024-11-23 ENCOUNTER — Ambulatory Visit: Payer: Self-pay

## 2024-12-21 ENCOUNTER — Ambulatory Visit: Payer: Self-pay | Admitting: Internal Medicine

## 2025-08-31 ENCOUNTER — Encounter: Admitting: Internal Medicine
